# Patient Record
Sex: Female | Born: 1960
Health system: Southern US, Community
[De-identification: ages and names within clinical notes are randomized; demographics above are authoritative.]

## PROBLEM LIST (undated history)

## (undated) DIAGNOSIS — I10 Essential (primary) hypertension: Secondary | ICD-10-CM

## (undated) DIAGNOSIS — R7303 Prediabetes: Secondary | ICD-10-CM

## (undated) DIAGNOSIS — K5792 Diverticulitis of intestine, part unspecified, without perforation or abscess without bleeding: Secondary | ICD-10-CM

## (undated) DIAGNOSIS — E559 Vitamin D deficiency, unspecified: Secondary | ICD-10-CM

## (undated) DIAGNOSIS — F329 Major depressive disorder, single episode, unspecified: Secondary | ICD-10-CM

## (undated) DIAGNOSIS — Z87898 Personal history of other specified conditions: Secondary | ICD-10-CM

## (undated) DIAGNOSIS — T7840XA Allergy, unspecified, initial encounter: Secondary | ICD-10-CM

## (undated) DIAGNOSIS — R0600 Dyspnea, unspecified: Secondary | ICD-10-CM

## (undated) DIAGNOSIS — H40059 Ocular hypertension, unspecified eye: Secondary | ICD-10-CM

## (undated) DIAGNOSIS — R011 Cardiac murmur, unspecified: Secondary | ICD-10-CM

## (undated) HISTORY — PX: OTHER SURGICAL HISTORY: SHX169

## (undated) HISTORY — DX: Essential (primary) hypertension: I10

## (undated) HISTORY — DX: Allergy, unspecified, initial encounter: T78.40XA

## (undated) HISTORY — PX: COLONOSCOPY: SHX174

---

## 2005-06-07 ENCOUNTER — Ambulatory Visit: Payer: Self-pay | Admitting: Obstetrics and Gynecology

## 2006-06-08 ENCOUNTER — Ambulatory Visit: Payer: Self-pay | Admitting: Obstetrics and Gynecology

## 2007-07-11 ENCOUNTER — Ambulatory Visit: Payer: Self-pay | Admitting: Obstetrics and Gynecology

## 2008-09-03 ENCOUNTER — Ambulatory Visit: Payer: Self-pay | Admitting: Obstetrics and Gynecology

## 2009-06-24 ENCOUNTER — Emergency Department: Payer: Self-pay | Admitting: Emergency Medicine

## 2009-07-09 ENCOUNTER — Other Ambulatory Visit: Payer: Self-pay | Admitting: Family

## 2009-07-28 ENCOUNTER — Ambulatory Visit: Payer: Self-pay | Admitting: Gastroenterology

## 2009-09-10 ENCOUNTER — Ambulatory Visit: Payer: Self-pay | Admitting: Family Medicine

## 2010-03-09 ENCOUNTER — Encounter: Payer: Self-pay | Admitting: Family Medicine

## 2010-03-14 ENCOUNTER — Encounter: Payer: Self-pay | Admitting: Family Medicine

## 2010-04-14 ENCOUNTER — Encounter: Payer: Self-pay | Admitting: Family Medicine

## 2010-11-03 ENCOUNTER — Ambulatory Visit: Payer: Self-pay | Admitting: Family Medicine

## 2011-09-15 ENCOUNTER — Ambulatory Visit: Payer: Self-pay | Admitting: Family Medicine

## 2012-01-26 ENCOUNTER — Ambulatory Visit: Payer: Self-pay | Admitting: Family Medicine

## 2012-11-30 ENCOUNTER — Other Ambulatory Visit: Payer: Self-pay | Admitting: Family Medicine

## 2012-11-30 LAB — CBC WITH DIFFERENTIAL/PLATELET
Basophil #: 0.1 10*3/uL (ref 0.0–0.1)
Basophil %: 0.9 %
Eosinophil #: 0.1 10*3/uL (ref 0.0–0.7)
Eosinophil %: 0.7 %
HGB: 11.9 g/dL — ABNORMAL LOW (ref 12.0–16.0)
MCHC: 32.9 g/dL (ref 32.0–36.0)
MCV: 89 fL (ref 80–100)
Monocyte #: 0.5 x10 3/mm (ref 0.2–0.9)
Neutrophil #: 6.2 10*3/uL (ref 1.4–6.5)
Neutrophil %: 69.3 %
Platelet: 324 10*3/uL (ref 150–440)
WBC: 8.9 10*3/uL (ref 3.6–11.0)

## 2012-11-30 LAB — LIPID PANEL
Cholesterol: 175 mg/dL (ref 0–200)
HDL Cholesterol: 74 mg/dL — ABNORMAL HIGH (ref 40–60)
Ldl Cholesterol, Calc: 87 mg/dL (ref 0–100)
Triglycerides: 72 mg/dL (ref 0–200)

## 2012-11-30 LAB — HEMOGLOBIN A1C: Hemoglobin A1C: 5.3 % (ref 4.2–6.3)

## 2012-11-30 LAB — COMPREHENSIVE METABOLIC PANEL
Alkaline Phosphatase: 122 U/L (ref 50–136)
Co2: 25 mmol/L (ref 21–32)
Creatinine: 0.76 mg/dL (ref 0.60–1.30)
EGFR (Non-African Amer.): >60
Glucose: 68 mg/dL (ref 65–99)
SGOT(AST): 19 U/L (ref 15–37)

## 2012-12-03 ENCOUNTER — Other Ambulatory Visit: Payer: Self-pay | Admitting: Family Medicine

## 2012-12-03 LAB — FERRITIN: Ferritin (ARMC): 96 ng/mL (ref 8–388)

## 2013-03-05 ENCOUNTER — Ambulatory Visit: Payer: Self-pay | Admitting: Family Medicine

## 2013-03-06 ENCOUNTER — Ambulatory Visit: Payer: Self-pay | Admitting: Family Medicine

## 2014-03-28 ENCOUNTER — Other Ambulatory Visit: Payer: Self-pay | Admitting: Family Medicine

## 2014-03-28 LAB — FOLATE: FOLIC ACID: 23.8 ng/mL (ref 3.1–100.0)

## 2014-07-11 ENCOUNTER — Other Ambulatory Visit: Payer: Self-pay | Admitting: Family Medicine

## 2014-07-11 LAB — LIPID PANEL
CHOLESTEROL: 171 mg/dL (ref 0–200)
HDL Cholesterol: 80 mg/dL — ABNORMAL HIGH (ref 40–60)
Ldl Cholesterol, Calc: 82 mg/dL (ref 0–100)
Triglycerides: 47 mg/dL (ref 0–200)
VLDL Cholesterol, Calc: 9 mg/dL (ref 5–40)

## 2014-07-15 ENCOUNTER — Ambulatory Visit: Payer: Self-pay | Admitting: Family Medicine

## 2016-02-10 ENCOUNTER — Encounter: Payer: Self-pay | Admitting: Family Medicine

## 2016-02-10 ENCOUNTER — Ambulatory Visit (INDEPENDENT_AMBULATORY_CARE_PROVIDER_SITE_OTHER): Payer: BLUE CROSS/BLUE SHIELD | Admitting: Family Medicine

## 2016-02-10 VITALS — BP 118/70 | HR 96 | Temp 98.2°F | Resp 16 | Ht 62.0 in | Wt 180.5 lb

## 2016-02-10 DIAGNOSIS — R7303 Prediabetes: Secondary | ICD-10-CM | POA: Diagnosis not present

## 2016-02-10 DIAGNOSIS — Z0000 Encounter for general adult medical examination without abnormal findings: Secondary | ICD-10-CM | POA: Diagnosis not present

## 2016-02-10 DIAGNOSIS — F320 Major depressive disorder, single episode, mild: Secondary | ICD-10-CM

## 2016-02-10 DIAGNOSIS — H40059 Ocular hypertension, unspecified eye: Secondary | ICD-10-CM | POA: Insufficient documentation

## 2016-02-10 DIAGNOSIS — Z01419 Encounter for gynecological examination (general) (routine) without abnormal findings: Secondary | ICD-10-CM

## 2016-02-10 DIAGNOSIS — Z13220 Encounter for screening for lipoid disorders: Secondary | ICD-10-CM

## 2016-02-10 DIAGNOSIS — F329 Major depressive disorder, single episode, unspecified: Secondary | ICD-10-CM

## 2016-02-10 DIAGNOSIS — E669 Obesity, unspecified: Secondary | ICD-10-CM

## 2016-02-10 DIAGNOSIS — E559 Vitamin D deficiency, unspecified: Secondary | ICD-10-CM

## 2016-02-10 DIAGNOSIS — I10 Essential (primary) hypertension: Secondary | ICD-10-CM | POA: Insufficient documentation

## 2016-02-10 DIAGNOSIS — Z1239 Encounter for other screening for malignant neoplasm of breast: Secondary | ICD-10-CM

## 2016-02-10 DIAGNOSIS — Z114 Encounter for screening for human immunodeficiency virus [HIV]: Secondary | ICD-10-CM | POA: Diagnosis not present

## 2016-02-10 DIAGNOSIS — H40053 Ocular hypertension, bilateral: Secondary | ICD-10-CM | POA: Diagnosis not present

## 2016-02-10 DIAGNOSIS — F32A Depression, unspecified: Secondary | ICD-10-CM

## 2016-02-10 DIAGNOSIS — Z124 Encounter for screening for malignant neoplasm of cervix: Secondary | ICD-10-CM

## 2016-02-10 DIAGNOSIS — J069 Acute upper respiratory infection, unspecified: Secondary | ICD-10-CM

## 2016-02-10 DIAGNOSIS — Z87898 Personal history of other specified conditions: Secondary | ICD-10-CM | POA: Insufficient documentation

## 2016-02-10 DIAGNOSIS — R011 Cardiac murmur, unspecified: Secondary | ICD-10-CM | POA: Insufficient documentation

## 2016-02-10 HISTORY — DX: Ocular hypertension, unspecified eye: H40.059

## 2016-02-10 NOTE — Progress Notes (Signed)
Name: Margaret Kramer   MRN: [id number]    DOB: 25-Sep-1961   Date:02/10/2016       Progress Note  Subjective  Chief Complaint  Chief Complaint  Patient presents with  . Annual Exam  . URI    patient has been taking care of her sick dad and got sick. she has been coughing since Thursday. productive. congested. patient has taken otc Thereaflu, Sudafed & Tylenol.  Marland Kitchen GI Problem    patient had a gi bug a few weeks ago due to vomiting and diarrhea.  . Orders    mammogram    HPI  Well Woman : she has not been sexually in over 4 years, no vaginal discharge, no post-menopausal bleeding or bladder problems  URI: she states over the past week she has developed nasal congestion, rhinorrhea, post-nasal drainage and cough. Symptoms are gradually improving. She has been afebrile. She has been taking otc medications  Prediabetes: she has gained weight since last visit, she denies polyphagia, polydipsia or polyuria.  Obesity: she has gained weight, she has been walking daily, and over the past month she has been trying to eat more fruit and vegetables.    Patient Active Problem List   Diagnosis Date Noted  . Cardiac murmur 02/10/2016  . History of syncope 02/10/2016  . Prediabetes 02/10/2016  . Increased pressure in the eye 02/10/2016  . Vitamin D deficiency 02/10/2016  . Obesity (BMI 30.0-34.9) 02/10/2016    Past Surgical History  Procedure Laterality Date  . Ganglion cyst removal Right     No family history on file.  Social History   Social History  . Marital Status: Single    Spouse Name: N/A  . Number of Children: N/A  . Years of Education: N/A   Occupational History  . Not on file.   Social History Main Topics  . Smoking status: Never Smoker   . Smokeless tobacco: Not on file  . Alcohol Use: 0.0 oz/week    0 Standard drinks or equivalent per week     Comment: occasional  . Drug Use: No  . Sexual Activity: No   Other Topics Concern  . Not on file   Social  History Narrative  . No narrative on file    No current outpatient prescriptions on file.  No Known Allergies   ROS  Constitutional: Negative for fever, positive for weight change.  Respiratory: Positive for cough but no shortness of breath.   Cardiovascular: Negative for chest pain or palpitations.  Gastrointestinal: Negative for abdominal pain, no bowel changes.  Musculoskeletal: Negative for gait problem or joint swelling.  Skin: Negative for rash.  Neurological: Negative for dizziness or headache.  No other specific complaints in a complete review of systems (except as listed in HPI above).  Objective  Filed Vitals:   02/10/16 1056  BP: 118/70  Pulse: 96  Temp: 98.2 F (36.8 C)  TempSrc: Oral  Resp: 16  Height: 5\' 2"  (1.575 m)  Weight: 180 lb 8 oz (81.874 kg)  SpO2: 99%    Body mass index is 33.01 kg/(m^2).  Physical Exam  Constitutional: Patient appears well-developed and well-nourished. No distress.  HENT: Head: Normocephalic and atraumatic. Ears: B TMs ok, no erythema or effusion; Nose: Nose normal. Mouth/Throat: Oropharynx is clear and moist. No oropharyngeal exudate.  Eyes: Conjunctivae and EOM are normal. Pupils are equal, round, and reactive to light. No scleral icterus.  Neck: Normal range of motion. Neck supple. No JVD present. No thyromegaly present.  Cardiovascular: Normal rate, regular rhythm and normal heart sounds.  No murmur heard. No BLE edema. Pulmonary/Chest: Effort normal and breath sounds normal. No respiratory distress. Abdominal: Soft. Bowel sounds are normal, no distension. There is no tenderness. no masses Breast: no lumps or masses, no nipple discharge or rashes FEMALE GENITALIA:  External genitalia normal External urethra normal Vaginal vault normal without discharge or lesions Cervix normal without discharge or lesions Bimanual exam normal without masses RECTAL: not done Musculoskeletal: Normal range of motion, no joint effusions.  No gross deformities Neurological: he is alert and oriented to person, place, and time. No cranial nerve deficit. Coordination, balance, strength, speech and gait are normal.  Skin: Skin is warm and dry. No rash noted. No erythema.  Psychiatric: Patient has a normal mood and affect. behavior is normal. Judgment and thought content normal.  PHQ2/9: Depression screen PHQ 2/9 02/10/2016  Decreased Interest 0  Down, Depressed, Hopeless 2  PHQ - 2 Score 2  Altered sleeping 2  Tired, decreased energy 2  Change in appetite 0  Feeling bad or failure about yourself  0  Trouble concentrating 0  Moving slowly or fidgety/restless 0  Suicidal thoughts 0  PHQ-9 Score 6     Fall Risk: Fall Risk  02/10/2016  Falls in the past year? No     Functional Status Survey: Is the patient deaf or have difficulty hearing?: No Does the patient have difficulty seeing, even when wearing glasses/contacts?: Yes (patient was seeing Dr. Gloriann Loan since 2014, was told that the pressure in her eyes were too high) Does the patient have difficulty concentrating, remembering, or making decisions?: No Does the patient have difficulty walking or climbing stairs?: No Does the patient have difficulty dressing or bathing?: No Does the patient have difficulty doing errands alone such as visiting a doctor's office or shopping?: No    Assessment & Plan  1. Well woman exam  Discussed importance of 150 minutes of physical activity weekly, eat two servings of fish weekly, eat one serving of tree nuts ( cashews, pistachios, pecans, almonds.Marland Kitchen) every other day, eat 6 servings of fruit/vegetables daily and drink plenty of water and avoid sweet beverages.   2. Prediabetes  - Hemoglobin A1c  3. Vitamin D deficiency  - VITAMIN D 25 Hydroxy (Vit-D Deficiency, Fractures)  4. Obesity (BMI 30.0-34.9)  Discussed with the patient the risk posed by an increased BMI. Discussed importance of portion control, calorie counting and at  least 150 minutes of physical activity weekly. Avoid sweet beverages and drink more water. Eat at least 6 servings of fruit and vegetables daily   5. Increased pressure in the eye, bilateral  -referral ophthalmologist   6. Breast cancer screening  - MM Digital Screening; Future  7. Cervical cancer screening  - PapLb, HPV, rfx16/18  8. Screening for HIV (human immunodeficiency virus)  - HIV antibody  9. Lipid screening  - Lipid panel  11. Mild depression  She does not want medications at this time, she states carrying for her father if taking a toll on her, also feeling like she is aging too fast. Discussed therapy, and she will contact [facility]

## 2016-02-11 ENCOUNTER — Other Ambulatory Visit: Payer: Self-pay | Admitting: Family Medicine

## 2016-02-11 LAB — LIPID PANEL
CHOLESTEROL TOTAL: 192 mg/dL (ref 100–199)
Chol/HDL Ratio: 3.2 ratio units (ref 0.0–4.4)
HDL: 60 mg/dL (ref >39)
LDL Calculated: 114 mg/dL — ABNORMAL HIGH (ref 0–99)
TRIGLYCERIDES: 89 mg/dL (ref 0–149)
VLDL CHOLESTEROL CAL: 18 mg/dL (ref 5–40)

## 2016-02-11 LAB — HEMOGLOBIN A1C
ESTIMATED AVERAGE GLUCOSE: 117 mg/dL
Hgb A1c MFr Bld: 5.7 % — ABNORMAL HIGH (ref 4.8–5.6)

## 2016-02-11 LAB — HIV ANTIBODY (ROUTINE TESTING W REFLEX): HIV SCREEN 4TH GENERATION: NONREACTIVE

## 2016-02-11 LAB — VITAMIN D 25 HYDROXY (VIT D DEFICIENCY, FRACTURES): VIT D 25 HYDROXY: 24.9 ng/mL — AB (ref 30.0–100.0)

## 2016-02-11 MED ORDER — VITAMIN D (ERGOCALCIFEROL) 1.25 MG (50000 UNIT) PO CAPS: 50000.0000 [IU] | ORAL_CAPSULE | ORAL | Status: DC

## 2016-02-13 LAB — PAPLB, HPV, RFX16/18
HPV, high-risk: NEGATIVE
PAP Smear Comment: 0

## 2016-02-15 ENCOUNTER — Telehealth: Payer: Self-pay

## 2016-02-15 NOTE — Telephone Encounter (Signed)
Called patient to inform her of her results, but then she told me that her father passed this morning. Her mother is a patient of Dr. Ancil Boozer.

## 2016-03-18 DIAGNOSIS — H40003 Preglaucoma, unspecified, bilateral: Secondary | ICD-10-CM | POA: Diagnosis not present

## 2016-06-15 ENCOUNTER — Ambulatory Visit
Admission: EM | Admit: 2016-06-15 | Discharge: 2016-06-15 | Disposition: A | Discharge status: Home / Self Care | Payer: BLUE CROSS/BLUE SHIELD | Attending: Emergency Medicine | Admitting: Emergency Medicine

## 2016-06-15 ENCOUNTER — Encounter: Payer: Self-pay | Admitting: *Deleted

## 2016-06-15 DIAGNOSIS — R1030 Lower abdominal pain, unspecified: Secondary | ICD-10-CM | POA: Diagnosis not present

## 2016-06-15 LAB — URINALYSIS COMPLETE WITH MICROSCOPIC (ARMC ONLY)
Bilirubin Urine: NEGATIVE
GLUCOSE, UA: NEGATIVE mg/dL
HGB URINE DIPSTICK: NEGATIVE
Ketones, ur: NEGATIVE mg/dL
LEUKOCYTES UA: NEGATIVE
Nitrite: NEGATIVE
PH: 7 (ref 5.0–8.0)
SQUAMOUS EPITHELIAL / LPF: NONE SEEN
Specific Gravity, Urine: 1.02 (ref 1.005–1.030)

## 2016-06-15 LAB — WET PREP, GENITAL
CLUE CELLS WET PREP: NONE SEEN
SPERM: NONE SEEN
TRICH WET PREP: NONE SEEN
YEAST WET PREP: NONE SEEN

## 2016-06-15 MED ORDER — NITROFURANTOIN MONOHYD MACRO 100 MG PO CAPS: 100.0000 mg | ORAL_CAPSULE | Freq: Two times a day (BID) | ORAL | 0 refills | Status: DC

## 2016-06-15 MED ORDER — PHENAZOPYRIDINE HCL 200 MG PO TABS: 200.0000 mg | ORAL_TABLET | Freq: Three times a day (TID) | ORAL | 0 refills | Status: DC | PRN

## 2016-06-15 MED ORDER — IBUPROFEN 800 MG PO TABS: 800.0000 mg | ORAL_TABLET | Freq: Three times a day (TID) | ORAL | 0 refills | Status: DC

## 2016-06-15 NOTE — ED Provider Notes (Signed)
HPI  SUBJECTIVE:  Margaret Kramer is a 55 y.o. female who presents with low midline abdominal/pelvic pain for the past day. She describes as constant and like pressure. She reports nausea, chills, cloudy urine. States that she had a loose, watery, nonbloody mucoid bowel movement this morning. Denies melena or hematochezia. She took an Alka-Seltzer pain with Tylenol at 0400 this morning with improvement. There are no aggravating factors. She has not tried anything else. She reports having an appetite, but is afraid to eat. She is tolerating by mouth. Pain is not associated with urination, defecation, movement or eating.  No sick contacts, change in diet. No N/V, fevers, CP, SOB, cough, anorexia, constipation. No abd distension, dysuria, urinary urgency, frequency, hematuria. No back pain. No syncope.  No vaginal bleeding, abnormal d/c, vaginal odor. Not been sexually active in 4 years. Is postmenopausal.. No ETOH abuse, pt is not a smoker. No excess NSAID use. No recent trauma to the abdomen. She has past medical history of hypertension and diverticulitis, remote history of UTIs. Also history of Trichomonas, yeast infections. No history of BV, gonorrhea, chlamydia, syphilis, HSV. No h/o MI, HTN, DM, CAD, afib, CHF. No h/o abdominal surgeries, PUD, biliary disease, cirrhosis, pancreatitis, diverticulosis, obstruction, GIB. No h/o obstructing nephrolithiasis, AAA. No h/o PID. No h/o CA, DVT/PE, hypercoagulability. No h/o UC, IBS, HIV, recent steroid use. last colonoscopy 8 years ago, had diveriticulae. PMD Dr. Ancil Boozer  Past Medical History:  Diagnosis Date  . Allergy   . Hypertension     Past Surgical History:  Procedure Laterality Date  . ganglion cyst removal Right     History reviewed. No pertinent family history.  Social History  Substance Use Topics  . Smoking status: Never Smoker  . Smokeless tobacco: Never Used  . Alcohol use 0.0 oz/week     Comment: occasional    No current  facility-administered medications for this encounter.   Current Outpatient Prescriptions:  Marland Kitchen  Vitamin D, Ergocalciferol, (DRISDOL) 50000 units CAPS capsule, Take 1 capsule (50,000 Units total) by mouth every 7 (seven) days., Disp: 12 capsule, Rfl: 0 .  ibuprofen (ADVIL,MOTRIN) 800 MG tablet, Take 1 tablet (800 mg total) by mouth 3 (three) times daily., Disp: 30 tablet, Rfl: 0 .  nitrofurantoin, macrocrystal-monohydrate, (MACROBID) 100 MG capsule, Take 1 capsule (100 mg total) by mouth 2 (two) times daily. X 5 days, Disp: 10 capsule, Rfl: 0 .  phenazopyridine (PYRIDIUM) 200 MG tablet, Take 1 tablet (200 mg total) by mouth 3 (three) times daily as needed for pain., Disp: 6 tablet, Rfl: 0  No Known Allergies   ROS  As noted in HPI.   Physical Exam  BP (!) 159/92 (BP Location: Left Arm)   Pulse 100   Temp 99.4 F (37.4 C) (Oral)   Resp 18   Ht 5\' 2"  (1.575 m)   Wt 180 lb (81.6 kg)   SpO2 100%   BMI 32.92 kg/m   Constitutional: Well developed, well nourished, no acute distress Eyes: PERRL, EOMI, conjunctiva normal bilaterally HENT: Normocephalic, atraumatic,mucus membranes moist Respiratory: Clear to auscultation bilaterally, no rales, no wheezing, no rhonchi Cardiovascular: Normal rate and rhythm, no murmurs, no gallops, no rubs GI:  normal appearance. Soft, nondistended, normal bowel sounds. positive suprapubic tenderness. no rebound, no guarding . Negative Murphy, negative McBurney. No right lower quadrant or left lower quadrant tenderness. No flank tenderness.  Back: no CVAT GU:  Normal external genitalia, no discharge. Unable to completely visualize os due to patient discomfort. Uterus smooth,  nontender. No CMT. No adnexal tenderness.  skin: No rash, skin intact Musculoskeletal: No edema, no tenderness, no deformities Neurologic: Alert & oriented x 3, CN II-XII grossly intact, no motor deficits, sensation grossly intact Psychiatric: Speech and behavior appropriate   ED  Course   Medications - No data to display  Orders Placed This Encounter  Procedures  . Pelvic exam    Standing Status:   Standing    Number of Occurrences:   1  . Wet prep, genital    Standing Status:   Standing    Number of Occurrences:   1  . Urine culture    Standing Status:   Standing    Number of Occurrences:   1    Order Specific Question:   List patient's active antibiotics    Answer:   macrobid  . Urinalysis complete, with microscopic    Standing Status:   Standing    Number of Occurrences:   1   Results for orders placed or performed during the hospital encounter of 06/15/16 (from the past 24 hour(s))  Urinalysis complete, with microscopic     Status: Abnormal   Collection Time: 06/15/16 12:06 PM  Result Value Ref Range   Color, Urine YELLOW YELLOW   APPearance CLEAR CLEAR   Glucose, UA NEGATIVE NEGATIVE mg/dL   Bilirubin Urine NEGATIVE NEGATIVE   Ketones, ur NEGATIVE NEGATIVE mg/dL   Specific Gravity, Urine 1.020 1.005 - 1.030   Hgb urine dipstick NEGATIVE NEGATIVE   pH 7.0 5.0 - 8.0   Protein, ur TRACE (A) NEGATIVE mg/dL   Nitrite NEGATIVE NEGATIVE   Leukocytes, UA NEGATIVE NEGATIVE   RBC / HPF 0-5 0 - 5 RBC/hpf   WBC, UA 0-5 0 - 5 WBC/hpf   Bacteria, UA RARE (A) NONE SEEN   Squamous Epithelial / LPF NONE SEEN NONE SEEN   Mucous PRESENT   Wet prep, genital     Status: Abnormal   Collection Time: 06/15/16 12:32 PM  Result Value Ref Range   Yeast Wet Prep HPF POC NONE SEEN NONE SEEN   Trich, Wet Prep NONE SEEN NONE SEEN   Clue Cells Wet Prep HPF POC NONE SEEN NONE SEEN   WBC, Wet Prep HPF POC FEW (A) NONE SEEN   Sperm NONE SEEN    No results found.  ED Clinical Impression  Lower abdominal pain   ED Assessment/Plan  [facility] narcotic database reviewed. No opiate Prescriptions in the past 6 months.  Patient with trace protein, no blood. No leukocytes, nitrite. Rare bacteria. Urine otherwise negative.  We'll check pelvic with wet prep only.  Given the patient has not had any intercourse in 4 years, not checking gonorrhea or Chlamydia. Wet prep negative for yeast, Trichomonas, BV.   Pt abd exam is benign, no peritoneal signs. No evidence of surgical abd. Doubt SBO, mesenteric ischemia, appendicitis, hepatitis, cholecystitis, pancreatitis, or perforated viscus. No evidence to support or suggest GYN pathology such as ovarian torsion or infection.   sending  urine off for culture to confirm absence of UTI. Patient may have an interstitial cystitis. In the meantime, home with Pyridium, 5 days of Macrobid, ibuprofen. Follow-up with PMD as needed. To the ER if gets worse or for any concerns. Discussed labs,  MDM, plan and followup with patient . Discussed sn/sx that should prompt return to the ED. Patient agrees with plan.   *This clinic note was created using Dragon dictation software. Therefore, there may be occasional mistakes despite careful proofreading.  ?  Melynda Ripple, MD 06/15/16 1251

## 2016-06-15 NOTE — ED Triage Notes (Signed)
Patient started having supra pubic abdominal pain yesterday with some early diarrhea. Patient does have a history of diverticulitis.

## 2016-06-16 LAB — URINE CULTURE

## 2016-09-15 DIAGNOSIS — H40003 Preglaucoma, unspecified, bilateral: Secondary | ICD-10-CM | POA: Diagnosis not present

## 2016-12-29 ENCOUNTER — Ambulatory Visit (INDEPENDENT_AMBULATORY_CARE_PROVIDER_SITE_OTHER): Payer: BLUE CROSS/BLUE SHIELD | Admitting: Family Medicine

## 2016-12-29 ENCOUNTER — Encounter: Payer: Self-pay | Admitting: Family Medicine

## 2016-12-29 VITALS — BP 136/92 | HR 94 | Temp 98.7°F | Resp 16 | Wt 172.6 lb

## 2016-12-29 DIAGNOSIS — I10 Essential (primary) hypertension: Secondary | ICD-10-CM

## 2016-12-29 DIAGNOSIS — F32A Depression, unspecified: Secondary | ICD-10-CM | POA: Insufficient documentation

## 2016-12-29 DIAGNOSIS — E559 Vitamin D deficiency, unspecified: Secondary | ICD-10-CM | POA: Diagnosis not present

## 2016-12-29 DIAGNOSIS — G4709 Other insomnia: Secondary | ICD-10-CM | POA: Diagnosis not present

## 2016-12-29 DIAGNOSIS — R7303 Prediabetes: Secondary | ICD-10-CM

## 2016-12-29 DIAGNOSIS — R1030 Lower abdominal pain, unspecified: Secondary | ICD-10-CM | POA: Diagnosis not present

## 2016-12-29 DIAGNOSIS — F320 Major depressive disorder, single episode, mild: Secondary | ICD-10-CM

## 2016-12-29 DIAGNOSIS — E669 Obesity, unspecified: Secondary | ICD-10-CM | POA: Diagnosis not present

## 2016-12-29 DIAGNOSIS — Z13220 Encounter for screening for lipoid disorders: Secondary | ICD-10-CM | POA: Diagnosis not present

## 2016-12-29 DIAGNOSIS — E66811 Obesity, class 1: Secondary | ICD-10-CM

## 2016-12-29 HISTORY — DX: Depression, unspecified: F32.A

## 2016-12-29 LAB — POCT URINALYSIS DIPSTICK
BILIRUBIN UA: NEGATIVE
Blood, UA: NEGATIVE
Glucose, UA: NEGATIVE
KETONES UA: NEGATIVE
Nitrite, UA: NEGATIVE
PH UA: 6
PROTEIN UA: NEGATIVE
SPEC GRAV UA: 1.02
Urobilinogen, UA: NEGATIVE

## 2016-12-29 MED ORDER — NITROFURANTOIN MONOHYD MACRO 100 MG PO CAPS: 100.0000 mg | ORAL_CAPSULE | Freq: Two times a day (BID) | ORAL | 0 refills | Status: DC

## 2016-12-29 NOTE — Progress Notes (Signed)
Name: Margaret Kramer   MRN: [id number]    DOB: 1961-08-10   Date:12/29/2016       Progress Note  Subjective  Chief Complaint  Chief Complaint  Patient presents with  . Urinary Tract Infection    frequency and painful urination for a couple of weeks and fullness     HPI  Supra pubic pain: she had similar symptoms last August and treated with Macrobid, urine showed contamination but she responded to therapy. She states this episode started a couple of weeks ago, she states that pain on supra pubic area after she voids and when she holds too long before she voids, no hematuria, fever, nausea or vomiting or back pain.   Major Depression: still missed her father that died about one year ago. She states she still has crying spells from missing him, even though he was 57 yo and had dementia. Discussed contacting hospice for free counseling . Brother diagnosed with lung cancer Fall 2017 and she is also worried about him.   Pre-diabetes: lost 8 lbs since last visit, but eating healthier, she denies polyphagia, polydipsia or polyuria.   Insomnia: she has been waking multiple times during the night over the past few months, she keeps the TV on all night. She worries about living by herself.   HTN: never on medication, her bp this week has been elevated in the 130's/90's, she wants to hold off on medication, usually bp well controlled, we will get labs and return for follow up in about one month  Patient Active Problem List   Diagnosis Date Noted  . Mild depression ([place]) 12/29/2016  . Cardiac murmur 02/10/2016  . History of syncope 02/10/2016  . Prediabetes 02/10/2016  . Increased pressure in the eye 02/10/2016  . Vitamin D deficiency 02/10/2016  . Obesity (BMI 30.0-34.9) 02/10/2016    Past Surgical History:  Procedure Laterality Date  . ganglion cyst removal Right     Family History  Problem Relation Age of Onset  . Diabetes Mother   . Hypothyroidism Mother   . Alcoholism Father    . Dementia Father   . Cancer Brother 48    lung cancer    Social History   Social History  . Marital status: Single    Spouse name: N/A  . Number of children: N/A  . Years of education: N/A   Occupational History  . Not on file.   Social History Main Topics  . Smoking status: Never Smoker  . Smokeless tobacco: Never Used  . Alcohol use 0.0 oz/week     Comment: occasional  . Drug use: No  . Sexual activity: No   Other Topics Concern  . Not on file   Social History Narrative  . No narrative on file     Current Outpatient Prescriptions:  .  Cholecalciferol (VITAMIN D) 2000 units CAPS, Take 1 capsule by mouth daily., Disp: , Rfl:  .  ibuprofen (ADVIL,MOTRIN) 800 MG tablet, Take 1 tablet (800 mg total) by mouth 3 (three) times daily., Disp: 30 tablet, Rfl: 0 .  nitrofurantoin, macrocrystal-monohydrate, (MACROBID) 100 MG capsule, Take 1 capsule (100 mg total) by mouth 2 (two) times daily. X 5 days, Disp: 10 capsule, Rfl: 0  No Known Allergies   ROS  Constitutional: Negative for fever, positive for  weight change.  Respiratory: Negative for cough and shortness of breath.   Cardiovascular: Negative for chest pain or palpitations.  Gastrointestinal: Negative for abdominal pain, no bowel changes.  Musculoskeletal: Negative  for gait problem or joint swelling.  Skin: Negative for rash.  Neurological: Negative for dizziness or headache.  No other specific complaints in a complete review of systems (except as listed in HPI above).  Objective  Vitals:   12/29/16 1232  BP: (!) 136/92  Pulse: 94  Resp: 16  Temp: 98.7 F (37.1 C)  TempSrc: Oral  SpO2: 97%  Weight: 172 lb 9 oz (78.3 kg)    Body mass index is 31.56 kg/m.  Physical Exam  Constitutional: Patient appears well-developed and well-nourished. Obese  No distress.  HEENT: head atraumatic, normocephalic, pupils equal and reactive to light,  neck supple, throat within normal limits Cardiovascular: Normal  rate, regular rhythm and normal heart sounds.  No murmur heard. No BLE edema. Pulmonary/Chest: Effort normal and breath sounds normal. No respiratory distress. Abdominal: Soft.  There is no tenderness. Negative CVA tenderness Psychiatric: Patient has a normal mood and affect. behavior is normal. Judgment and thought content normal.  Recent Results (from the past 2160 hour(s))  POCT urinalysis dipstick     Status: Abnormal   Collection Time: 12/29/16 12:47 PM  Result Value Ref Range   Color, UA yellow    Clarity, UA clear    Glucose, UA neg    Bilirubin, UA neg    Ketones, UA neg    Spec Grav, UA 1.020    Blood, UA neg    pH, UA 6.0    Protein, UA neg    Urobilinogen, UA negative    Nitrite, UA neg    Leukocytes, UA small (1+) (A) Negative     PHQ2/9: Depression screen [facility] 2/9 12/29/2016 02/10/2016  Decreased Interest 0 0  Down, Depressed, Hopeless 0 2  PHQ - 2 Score 0 2  Altered sleeping 3 2  Tired, decreased energy 2 2  Change in appetite 0 0  Feeling bad or failure about yourself  0 0  Trouble concentrating 1 0  Moving slowly or fidgety/restless 0 0  Suicidal thoughts 0 0  PHQ-9 Score 6 6     Fall Risk: Fall Risk  02/10/2016  Falls in the past year? No     Assessment & Plan  1. Lower abdominal pain  - Urine culture - nitrofurantoin, macrocrystal-monohydrate, (MACROBID) 100 MG capsule; Take 1 capsule (100 mg total) by mouth 2 (two) times daily. X 5 days  Dispense: 10 capsule; Refill: 0  2. Vitamin D deficiency  - VITAMIN D 25 Hydroxy (Vit-D Deficiency, Fractures)  3. Prediabetes  Recheck labs  4. Obesity (BMI 30.0-34.9)  - Hemoglobin A1c - Insulin, fasting  5. Mild depression ([facility])  - COMPLETE METABOLIC PANEL WITH GFR - CBC with Differential/Platelet  6. Lipid screening  - Lipid panel  7. Hypertension, benign  We will recheck on her next visit, she has never been on medication and prefers not to start today  - COMPLETE METABOLIC PANEL WITH  GFR - CBC with Differential/Platelet  8. Other insomnia  She will turn off TV when she goes to bed, she does not want to start medication at this time

## 2016-12-31 LAB — URINE CULTURE: Organism ID, Bacteria: NO GROWTH

## 2017-02-23 ENCOUNTER — Other Ambulatory Visit: Payer: Self-pay | Admitting: Family Medicine

## 2017-02-23 DIAGNOSIS — E669 Obesity, unspecified: Secondary | ICD-10-CM | POA: Diagnosis not present

## 2017-02-23 DIAGNOSIS — F320 Major depressive disorder, single episode, mild: Secondary | ICD-10-CM | POA: Diagnosis not present

## 2017-02-23 DIAGNOSIS — E559 Vitamin D deficiency, unspecified: Secondary | ICD-10-CM | POA: Diagnosis not present

## 2017-02-23 DIAGNOSIS — I10 Essential (primary) hypertension: Secondary | ICD-10-CM | POA: Diagnosis not present

## 2017-02-23 LAB — COMPLETE METABOLIC PANEL WITH GFR
ALBUMIN: 4.2 g/dL (ref 3.6–5.1)
ALK PHOS: 101 U/L (ref 33–130)
ALT: 20 U/L (ref 6–29)
AST: 15 U/L (ref 10–35)
BUN: 19 mg/dL (ref 7–25)
CALCIUM: 9.5 mg/dL (ref 8.6–10.4)
CO2: 29 mmol/L (ref 20–31)
CREATININE: 0.75 mg/dL (ref 0.50–1.05)
Chloride: 103 mmol/L (ref 98–110)
GFR, Est African American: >89 mL/min (ref >=60)
GFR, Est Non African American: 89 mL/min (ref >=60)
Glucose, Bld: 93 mg/dL (ref 65–99)
Potassium: 4.8 mmol/L (ref 3.5–5.3)
Sodium: 138 mmol/L (ref 135–146)
TOTAL PROTEIN: 7.3 g/dL (ref 6.1–8.1)
Total Bilirubin: 0.5 mg/dL (ref 0.2–1.2)

## 2017-02-23 LAB — CBC WITH DIFFERENTIAL/PLATELET
Basophils Absolute: 0 cells/uL (ref 0–200)
Basophils Relative: 0 %
EOS PCT: 4 %
Eosinophils Absolute: 196 cells/uL (ref 15–500)
HEMATOCRIT: 38.5 % (ref 35.0–45.0)
Hemoglobin: 12.4 g/dL (ref 11.7–15.5)
LYMPHS PCT: 59 %
Lymphs Abs: 2891 cells/uL (ref 850–3900)
MCH: 28.7 pg (ref 27.0–33.0)
MCHC: 32.2 g/dL (ref 32.0–36.0)
MCV: 89.1 fL (ref 80.0–100.0)
MONO ABS: 245 {cells}/uL (ref 200–950)
MPV: 9.8 fL (ref 7.5–12.5)
Monocytes Relative: 5 %
NEUTROS PCT: 32 %
Neutro Abs: 1568 cells/uL (ref 1500–7800)
Platelets: 321 10*3/uL (ref 140–400)
RBC: 4.32 MIL/uL (ref 3.80–5.10)
RDW: 13.3 % (ref 11.0–15.0)
WBC: 4.9 10*3/uL (ref 3.8–10.8)

## 2017-02-23 LAB — LIPID PANEL
Cholesterol: 199 mg/dL (ref <200)
HDL: 86 mg/dL (ref >50)
LDL Cholesterol: 100 mg/dL — ABNORMAL HIGH (ref <100)
Total CHOL/HDL Ratio: 2.3 Ratio (ref <5.0)
Triglycerides: 63 mg/dL (ref <150)
VLDL: 13 mg/dL (ref <30)

## 2017-02-24 LAB — INSULIN, FASTING: Insulin fasting, serum: 7.8 u[IU]/mL (ref 2.0–19.6)

## 2017-02-24 LAB — HEMOGLOBIN A1C
HEMOGLOBIN A1C: 5.3 % (ref <5.7)
MEAN PLASMA GLUCOSE: 105 mg/dL

## 2017-02-24 LAB — VITAMIN D 25 HYDROXY (VIT D DEFICIENCY, FRACTURES): VIT D 25 HYDROXY: 29 ng/mL — AB (ref 30–100)

## 2017-02-28 ENCOUNTER — Encounter: Payer: Self-pay | Admitting: Family Medicine

## 2017-02-28 ENCOUNTER — Ambulatory Visit (INDEPENDENT_AMBULATORY_CARE_PROVIDER_SITE_OTHER): Payer: BLUE CROSS/BLUE SHIELD | Admitting: Family Medicine

## 2017-02-28 ENCOUNTER — Other Ambulatory Visit: Payer: Self-pay | Admitting: Family Medicine

## 2017-02-28 VITALS — BP 128/70 | HR 92 | Temp 98.3°F | Resp 16 | Ht 62.0 in | Wt 174.9 lb

## 2017-02-28 DIAGNOSIS — Z1231 Encounter for screening mammogram for malignant neoplasm of breast: Secondary | ICD-10-CM

## 2017-02-28 DIAGNOSIS — Z0001 Encounter for general adult medical examination with abnormal findings: Secondary | ICD-10-CM | POA: Diagnosis not present

## 2017-02-28 DIAGNOSIS — Z1239 Encounter for other screening for malignant neoplasm of breast: Secondary | ICD-10-CM

## 2017-02-28 DIAGNOSIS — R1030 Lower abdominal pain, unspecified: Secondary | ICD-10-CM | POA: Diagnosis not present

## 2017-02-28 DIAGNOSIS — N3946 Mixed incontinence: Secondary | ICD-10-CM

## 2017-02-28 DIAGNOSIS — Z01419 Encounter for gynecological examination (general) (routine) without abnormal findings: Secondary | ICD-10-CM

## 2017-02-28 NOTE — Progress Notes (Signed)
Name: Margaret Kramer   MRN: [id number]    DOB: 1960/12/09   Date:02/28/2017       Progress Note  Subjective  Chief Complaint  Chief Complaint  Patient presents with  . Annual Exam    HPI  Well woman: she is not sexually active, normal pap smear, negative HPV back in 2017, no vaginal discharge, she has some urinary stress incontinence, also has some urgency ( chronic ) - she does not want to see PT or start medication at this time. Due for mammogram ( no breast lumps), colonoscopy is up to date.   Left lower quadrant pain: symptoms going on for about 9 months, intermittent, initially lower abdomen, but after that is left lower quadrant, cramping like, it happens in spells for about 3 days but is not constant during episodes. Denies change in bowel movements -has bristol scale 3-4 - and two to three times daily, no blood or mucus in stools, no fever or change in appetite.    Patient Active Problem List   Diagnosis Date Noted  . Mild depression ([place]) 12/29/2016  . Other insomnia 12/29/2016  . Cardiac murmur 02/10/2016  . Hypertension, benign 02/10/2016  . History of syncope 02/10/2016  . Prediabetes 02/10/2016  . Increased pressure in the eye 02/10/2016  . Vitamin D deficiency 02/10/2016  . Obesity (BMI 30.0-34.9) 02/10/2016    Past Surgical History:  Procedure Laterality Date  . ganglion cyst removal Right     Family History  Problem Relation Age of Onset  . Diabetes Mother   . Hypothyroidism Mother   . Alcoholism Father   . Dementia Father   . Cancer Brother 28    lung cancer    Social History   Social History  . Marital status: Single    Spouse name: N/A  . Number of children: N/A  . Years of education: N/A   Occupational History  . Not on file.   Social History Main Topics  . Smoking status: Never Smoker  . Smokeless tobacco: Never Used  . Alcohol use 0.0 oz/week     Comment: occasionally  . Drug use: No  . Sexual activity: No   Other Topics  Concern  . Not on file   Social History Narrative  . No narrative on file     Current Outpatient Prescriptions:  .  Cholecalciferol (VITAMIN D) 2000 units CAPS, Take 1 capsule by mouth daily., Disp: , Rfl:  .  ibuprofen (ADVIL,MOTRIN) 800 MG tablet, Take 1 tablet (800 mg total) by mouth 3 (three) times daily., Disp: 30 tablet, Rfl: 0  No Known Allergies   ROS  Constitutional: Negative for fever or weight change.  Respiratory: Negative for cough and shortness of breath.   Cardiovascular: Negative for chest pain or palpitations.  Gastrointestinal: Positive  For intermittent  abdominal pain, no bowel changes.  Musculoskeletal: Negative for gait problem or joint swelling.  Skin: Negative for rash.  Neurological: Negative for dizziness or headache.  No other specific complaints in a complete review of systems (except as listed in HPI above).  Objective  Vitals:   02/28/17 1412  BP: 128/70  Pulse: 92  Resp: 16  Temp: 98.3 F (36.8 C)  TempSrc: Oral  SpO2: 98%  Weight: 174 lb 14.4 oz (79.3 kg)  Height: _0  (1.575 m)    Body mass index is 31.99 kg/m.  Physical Exam  Constitutional: Patient appears well-developed and obesity. No distress.  HENT: Head: Normocephalic and atraumatic. Ears: B TMs  ok, no erythema or effusion; Nose: Nose normal. Mouth/Throat: Oropharynx is clear and moist. No oropharyngeal exudate.  Eyes: Conjunctivae and EOM are normal. Pupils are equal, round, and reactive to light. No scleral icterus.  Neck: Normal range of motion. Neck supple. No JVD present. No thyromegaly present.  Cardiovascular: Normal rate, regular rhythm and normal heart sounds.  No murmur heard. No BLE edema. Pulmonary/Chest: Effort normal and breath sounds normal. No respiratory distress. Abdominal: Soft. Bowel sounds are normal, no distension. There is no tenderness. no masses Breast: lumpy breast, no nipple discharge or rashes FEMALE GENITALIA:  External genitalia shows some  hypopigmentation and lichenification between vagina and anus External urethra normal Normal bimanual exam RECTAL: no rectal masses or hemorrhoids Musculoskeletal: Normal range of motion, no joint effusions. No gross deformities Neurological: he is alert and oriented to person, place, and time. No cranial nerve deficit. Coordination, balance, strength, speech and gait are normal.  Skin: Skin is warm and dry. No rash noted. No erythema.  Psychiatric: Patient has a normal mood and affect. behavior is normal. Judgment and thought content normal.  Recent Results (from the past 2160 hour(s))  POCT urinalysis dipstick     Status: Abnormal   Collection Time: 12/29/16 12:47 PM  Result Value Ref Range   Color, UA yellow    Clarity, UA clear    Glucose, UA neg    Bilirubin, UA neg    Ketones, UA neg    Spec Grav, UA 1.020    Blood, UA neg    pH, UA 6.0    Protein, UA neg    Urobilinogen, UA negative    Nitrite, UA neg    Leukocytes, UA small (1+) (A) Negative  Urine culture     Status: None   Collection Time: 12/29/16  1:52 PM  Result Value Ref Range   Organism ID, Bacteria NO GROWTH   COMPLETE METABOLIC PANEL WITH GFR     Status: None   Collection Time: 02/23/17 10:08 AM  Result Value Ref Range   Sodium 138 135 - 146 mmol/L   Potassium 4.8 3.5 - 5.3 mmol/L   Chloride 103 98 - 110 mmol/L   CO2 29 20 - 31 mmol/L   Glucose, Bld 93 65 - 99 mg/dL   BUN 19 7 - 25 mg/dL   Creat 0.75 0.50 - 1.05 mg/dL    Comment:   For patients > or = 56 years of age: The upper reference limit for Creatinine is approximately 13% higher for people identified as African-American.      Total Bilirubin 0.5 0.2 - 1.2 mg/dL   Alkaline Phosphatase 101 33 - 130 U/L   AST 15 10 - 35 U/L   ALT 20 6 - 29 U/L   Total Protein 7.3 6.1 - 8.1 g/dL   Albumin 4.2 3.6 - 5.1 g/dL   Calcium 9.5 8.6 - 10.4 mg/dL   GFR, Est African American >89 >=60 mL/min   GFR, Est Non African American 89 >=60 mL/min  CBC with  Differential/Platelet     Status: None   Collection Time: 02/23/17 10:08 AM  Result Value Ref Range   WBC 4.9 3.8 - 10.8 K/uL   RBC 4.32 3.80 - 5.10 MIL/uL   Hemoglobin 12.4 11.7 - 15.5 g/dL   HCT 38.5 35.0 - 45.0 %   MCV 89.1 80.0 - 100.0 fL   MCH 28.7 27.0 - 33.0 pg   MCHC 32.2 32.0 - 36.0 g/dL   RDW 13.3 11.0 -  15.0 %   Platelets 321 140 - 400 K/uL   MPV 9.8 7.5 - 12.5 fL   Neutro Abs 1,568 1,500 - 7,800 cells/uL   Lymphs Abs 2,891 850 - 3,900 cells/uL   Monocytes Absolute 245 200 - 950 cells/uL   Eosinophils Absolute 196 15 - 500 cells/uL   Basophils Absolute 0 0 - 200 cells/uL   Neutrophils Relative % 32 %   Lymphocytes Relative 59 %   Monocytes Relative 5 %   Eosinophils Relative 4 %   Basophils Relative 0 %   Smear Review Criteria for review not met   Lipid panel     Status: Abnormal   Collection Time: 02/23/17 10:08 AM  Result Value Ref Range   Cholesterol 199 <200 mg/dL   Triglycerides 63 <150 mg/dL   HDL 86 >50 mg/dL   Total CHOL/HDL Ratio 2.3 <5.0 Ratio   VLDL 13 <30 mg/dL   LDL Cholesterol 100 (H) <100 mg/dL  Hemoglobin A1c     Status: None   Collection Time: 02/23/17 10:08 AM  Result Value Ref Range   Hgb A1c MFr Bld 5.3 <5.7 %    Comment:   For the purpose of screening for the presence of diabetes:   <5.7%       Consistent with the absence of diabetes 5.7-6.4 %   Consistent with increased risk for diabetes (prediabetes) >=6.5 %     Consistent with diabetes   This assay result is consistent with a decreased risk of diabetes.   Currently, no consensus exists regarding use of hemoglobin A1c for diagnosis of diabetes in children.   According to American Diabetes Association (ADA) guidelines, hemoglobin A1c <7.0% represents optimal control in non-pregnant diabetic patients. Different metrics may apply to specific patient populations. Standards of Medical Care in Diabetes (ADA).      Mean Plasma Glucose 105 mg/dL  Insulin, fasting     Status: None    Collection Time: 02/23/17 10:08 AM  Result Value Ref Range   Insulin fasting, serum 7.8 2.0 - 19.6 uIU/mL    Comment:   This insulin assay shows strong cross-reactivity for some insulin analogs (lispro, aspart, and glargine) and much lower cross-reactivity with others (detemir, glulisine).   Stimulated Insulin reference intervals were established using the Siemens Immulite assay. These values are provided for general guidance only.   VITAMIN D 25 Hydroxy (Vit-D Deficiency, Fractures)     Status: Abnormal   Collection Time: 02/23/17 10:08 AM  Result Value Ref Range   Vit D, 25-Hydroxy 29 (L) 30 - 100 ng/mL    Comment: Vitamin D Status           25-OH Vitamin D        Deficiency                <20 ng/mL        Insufficiency         20 - 29 ng/mL        Optimal             > or = 30 ng/mL   For 25-OH Vitamin D testing on patients on D2-supplementation and patients for whom quantitation of D2 and D3 fractions is required, the QuestAssureD 25-OH VIT D, (D2,D3), LC/MS/MS is recommended: order code [id number] (patients > 2 yrs).      PHQ2/9: Depression screen [facility] 2/9 02/28/2017 12/29/2016 02/10/2016  Decreased Interest 0 0 0  Down, Depressed, Hopeless 0 0 2  PHQ - 2 Score 0  0 2  Altered sleeping - 3 2  Tired, decreased energy - 2 2  Change in appetite - 0 0  Feeling bad or failure about yourself  - 0 0  Trouble concentrating - 1 0  Moving slowly or fidgety/restless - 0 0  Suicidal thoughts - 0 0  PHQ-9 Score - 6 6     Fall Risk: Fall Risk  02/28/2017 02/10/2016  Falls in the past year? No No     Functional Status Survey: Is the patient deaf or have difficulty hearing?: No Does the patient have difficulty seeing, even when wearing glasses/contacts?: No Does the patient have difficulty concentrating, remembering, or making decisions?: No Does the patient have difficulty walking or climbing stairs?: No Does the patient have difficulty dressing or bathing?: No Does the patient  have difficulty doing errands alone such as visiting a doctor's office or shopping?: No   Assessment & Plan  1. Well woman exam  Discussed importance of 150 minutes of physical activity weekly, eat two servings of fish weekly, eat one serving of tree nuts ( cashews, pistachios, pecans, almonds.Marland Kitchen) every other day, eat 6 servings of fruit/vegetables daily and drink plenty of water and avoid sweet beverages.   2. Breast cancer screening  We will schedule it for her  3. Lower abdominal pain  Going on for months now, not associated with change in bowel movements, fever or chills, she has her ovaries, we will check pelvic US to see if normal ovaries, colonoscopy is up to date.  - US Pelvis Complete; Future If normal pelvic US, we will send her back to have repeat colonoscopy   4. Mixed incontinence urge and stress  She will try cutting down on caffeine

## 2017-03-07 ENCOUNTER — Ambulatory Visit
Admission: RE | Admit: 2017-03-07 | Discharge: 2017-03-07 | Disposition: A | Discharge status: Home / Self Care | Payer: BLUE CROSS/BLUE SHIELD | Source: Ambulatory Visit | Attending: Family Medicine | Admitting: Family Medicine

## 2017-03-07 DIAGNOSIS — R1030 Lower abdominal pain, unspecified: Secondary | ICD-10-CM | POA: Diagnosis not present

## 2017-03-07 DIAGNOSIS — N854 Malposition of uterus: Secondary | ICD-10-CM | POA: Diagnosis not present

## 2017-03-07 DIAGNOSIS — D259 Leiomyoma of uterus, unspecified: Secondary | ICD-10-CM | POA: Insufficient documentation

## 2017-03-07 DIAGNOSIS — Z1231 Encounter for screening mammogram for malignant neoplasm of breast: Secondary | ICD-10-CM | POA: Diagnosis not present

## 2017-03-07 DIAGNOSIS — N3946 Mixed incontinence: Secondary | ICD-10-CM

## 2017-03-09 DIAGNOSIS — H40003 Preglaucoma, unspecified, bilateral: Secondary | ICD-10-CM | POA: Diagnosis not present

## 2017-03-30 DIAGNOSIS — H40003 Preglaucoma, unspecified, bilateral: Secondary | ICD-10-CM | POA: Diagnosis not present

## 2017-03-30 LAB — HM DIABETES EYE EXAM

## 2017-04-04 ENCOUNTER — Encounter: Payer: Self-pay | Admitting: Family Medicine

## 2017-04-26 ENCOUNTER — Telehealth: Payer: Self-pay | Admitting: Family Medicine

## 2017-04-26 NOTE — Telephone Encounter (Signed)
Pt saw her test results and would like to know what are her options. The pain is more now than what it was before. Please return call [phone]

## 2017-04-27 NOTE — Telephone Encounter (Signed)
We can refer her to GI for lower abdominal pain, we can also place a referral to gyn because of fibroids, but not sure if that is the cause of her pain

## 2017-04-27 NOTE — Telephone Encounter (Signed)
Patient will keep monitoring her pain and let us know if she would like a referral to GYN.

## 2017-09-26 DIAGNOSIS — H40003 Preglaucoma, unspecified, bilateral: Secondary | ICD-10-CM | POA: Diagnosis not present

## 2017-12-01 ENCOUNTER — Encounter: Payer: Self-pay | Admitting: Family Medicine

## 2017-12-01 ENCOUNTER — Ambulatory Visit: Payer: BLUE CROSS/BLUE SHIELD | Admitting: Family Medicine

## 2017-12-01 VITALS — BP 130/90 | HR 83 | Resp 12 | Ht 62.0 in | Wt 169.7 lb

## 2017-12-01 DIAGNOSIS — R7303 Prediabetes: Secondary | ICD-10-CM | POA: Diagnosis not present

## 2017-12-01 DIAGNOSIS — R109 Unspecified abdominal pain: Secondary | ICD-10-CM | POA: Diagnosis not present

## 2017-12-01 DIAGNOSIS — F320 Major depressive disorder, single episode, mild: Secondary | ICD-10-CM | POA: Diagnosis not present

## 2017-12-01 DIAGNOSIS — R110 Nausea: Secondary | ICD-10-CM | POA: Diagnosis not present

## 2017-12-01 DIAGNOSIS — R634 Abnormal weight loss: Secondary | ICD-10-CM

## 2017-12-01 DIAGNOSIS — F439 Reaction to severe stress, unspecified: Secondary | ICD-10-CM

## 2017-12-01 DIAGNOSIS — R350 Frequency of micturition: Secondary | ICD-10-CM | POA: Diagnosis not present

## 2017-12-01 DIAGNOSIS — I10 Essential (primary) hypertension: Secondary | ICD-10-CM

## 2017-12-01 DIAGNOSIS — F32A Depression, unspecified: Secondary | ICD-10-CM

## 2017-12-01 LAB — CBC WITH DIFFERENTIAL/PLATELET
BASOS PCT: 0.5 %
Basophils Absolute: 32 cells/uL (ref 0–200)
EOS PCT: 2.4 %
Eosinophils Absolute: 154 cells/uL (ref 15–500)
HCT: 37.3 % (ref 35.0–45.0)
HEMOGLOBIN: 12.4 g/dL (ref 11.7–15.5)
Lymphs Abs: 3520 cells/uL (ref 850–3900)
MCH: 29 pg (ref 27.0–33.0)
MCHC: 33.2 g/dL (ref 32.0–36.0)
MCV: 87.1 fL (ref 80.0–100.0)
MONOS PCT: 6 %
MPV: 10.1 fL (ref 7.5–12.5)
NEUTROS ABS: 2310 {cells}/uL (ref 1500–7800)
Neutrophils Relative %: 36.1 %
PLATELETS: 405 10*3/uL — AB (ref 140–400)
RBC: 4.28 10*6/uL (ref 3.80–5.10)
RDW: 11.9 % (ref 11.0–15.0)
TOTAL LYMPHOCYTE: 55 %
WBC mixed population: 384 cells/uL (ref 200–950)
WBC: 6.4 10*3/uL (ref 3.8–10.8)

## 2017-12-01 LAB — POCT URINALYSIS DIPSTICK
Bilirubin, UA: NEGATIVE
Glucose, UA: NEGATIVE
KETONES UA: NEGATIVE
Leukocytes, UA: NEGATIVE
NITRITE UA: NEGATIVE
PH UA: 5 (ref 5.0–8.0)
Protein, UA: NORMAL
RBC UA: NEGATIVE
Spec Grav, UA: 1.02 (ref 1.010–1.025)
UROBILINOGEN UA: NEGATIVE U/dL — AB

## 2017-12-01 LAB — COMPLETE METABOLIC PANEL WITH GFR
AG RATIO: 1.6 (calc) (ref 1.0–2.5)
ALKALINE PHOSPHATASE (APISO): 87 U/L (ref 33–130)
ALT: 19 U/L (ref 6–29)
AST: 14 U/L (ref 10–35)
Albumin: 4.5 g/dL (ref 3.6–5.1)
BUN: 17 mg/dL (ref 7–25)
CALCIUM: 9.6 mg/dL (ref 8.6–10.4)
CHLORIDE: 104 mmol/L (ref 98–110)
CO2: 29 mmol/L (ref 20–32)
Creat: 0.86 mg/dL (ref 0.50–1.05)
GFR, Est African American: 87 mL/min/{1.73_m2} (ref >=60)
GFR, Est Non African American: 75 mL/min/{1.73_m2} (ref >=60)
Globulin: 2.8 g/dL (calc) (ref 1.9–3.7)
Glucose, Bld: 83 mg/dL (ref 65–99)
POTASSIUM: 4.6 mmol/L (ref 3.5–5.3)
Sodium: 138 mmol/L (ref 135–146)
Total Bilirubin: 0.4 mg/dL (ref 0.2–1.2)
Total Protein: 7.3 g/dL (ref 6.1–8.1)

## 2017-12-01 LAB — POCT GLYCOSYLATED HEMOGLOBIN (HGB A1C): Hemoglobin A1C: 5.5

## 2017-12-01 NOTE — Progress Notes (Signed)
Name: Margaret Kramer   MRN: [id number]    DOB: 1961/04/04   Date:12/01/2017       Progress Note  Subjective  Chief Complaint  Chief Complaint  Patient presents with  . Follow-up  . Nausea    HPI  Major Depression: still missed her father that died over one year ago. She now worries about her mother living with her brothers ( they are all spoiled ), she is now worried about her dog that is dying, Her PHQ9 is 7 but she refuses medication  Pre-diabetes: she los another 6  lbs since last visit, but eating healthier, she denies polyphagia, polydipsia but she has polyuria.    HTN: never on medication, her bp this week has been elevated in the 130's/90's, she wants to hold off on medication, she wants to continue to monitor for now. She states at home bp is usually 125/85  Recurrent abdominal pain:since Feb she comes in with intermittent and hard to explain abdominal symptoms, now losing weight we will check a CT abdomen and pelvis. She had some diarrhea about one month ago and is also having nausea intermittently for the past month, we need to rule out ovarian cancer, if negative CT refer to GI  Patient Active Problem List   Diagnosis Date Noted  . Mild depression ([place]) 12/29/2016  . Other insomnia 12/29/2016  . Cardiac murmur 02/10/2016  . Hypertension, benign 02/10/2016  . History of syncope 02/10/2016  . Prediabetes 02/10/2016  . Increased pressure in the eye 02/10/2016  . Vitamin D deficiency 02/10/2016  . Obesity (BMI 30.0-34.9) 02/10/2016    Past Surgical History:  Procedure Laterality Date  . ganglion cyst removal Right     Family History  Problem Relation Age of Onset  . Diabetes Mother   . Hypothyroidism Mother   . Alcoholism Father   . Dementia Father   . Cancer Brother 20       lung cancer  . Breast cancer Maternal Aunt 80  . Breast cancer Cousin 37       mat cousin    Social History   Socioeconomic History  . Marital status: Single    Spouse name:  Not on file  . Number of children: Not on file  . Years of education: Not on file  . Highest education level: Not on file  Social Needs  . Financial resource strain: Not on file  . Food insecurity - worry: Not on file  . Food insecurity - inability: Not on file  . Transportation needs - medical: Not on file  . Transportation needs - non-medical: Not on file  Occupational History  . Not on file  Tobacco Use  . Smoking status: Never Smoker  . Smokeless tobacco: Never Used  Substance and Sexual Activity  . Alcohol use: Yes    Alcohol/week: 0.0 oz    Comment: occasionally  . Drug use: No  . Sexual activity: No  Other Topics Concern  . Not on file  Social History Narrative  . Not on file     Current Outpatient Medications:  .  Cholecalciferol (VITAMIN D) 2000 units CAPS, Take 1 capsule by mouth daily., Disp: , Rfl:  .  ibuprofen (ADVIL,MOTRIN) 800 MG tablet, Take 1 tablet (800 mg total) by mouth 3 (three) times daily. (Patient not taking: Reported on 12/01/2017), Disp: 30 tablet, Rfl: 0  No Known Allergies   ROS  Constitutional: Negative for fever , positive for mild  weight change.  Respiratory: Negative  for cough and shortness of breath.   Cardiovascular: Negative for chest pain or palpitations.  Gastrointestinal: Positive  For intermittent  abdominal pain, no bowel changes - currently .  Musculoskeletal: Negative for gait problem or joint swelling.  Skin: Negative for rash.  Neurological: Negative for dizziness or headache.  No other specific complaints in a complete review of systems (except as listed in HPI above).  Objective  Vitals:   12/01/17 1357  BP: 130/90  Pulse: 83  Resp: 12  SpO2: 98%  Weight: 169 lb 11.2 oz (77 kg)  Height: 5\' 2"  (1.575 m)    Body mass index is 31.04 kg/m.  Physical Exam  Constitutional: Patient appears well-developed and well-nourished. Obese  No distress.  HEENT: head atraumatic, normocephalic, pupils equal and reactive to  light,neck supple, throat within normal limits Cardiovascular: Normal rate, regular rhythm and normal heart sounds.  No murmur heard. No BLE edema. Pulmonary/Chest: Effort normal and breath sounds normal. No respiratory distress. Abdominal: Soft.  There is no tenderness, no palpable masses  Psychiatric: Patient has a normal mood and affect. behavior is normal. Judgment and thought content normal.  Recent Results (from the past 2160 hour(s))  POCT Urinalysis Dipstick     Status: Abnormal   Collection Time: 12/01/17  2:06 PM  Result Value Ref Range   Color, UA yellow    Clarity, UA clear    Glucose, UA negative    Bilirubin, UA negative    Ketones, UA negative    Spec Grav, UA 1.020 1.010 - 1.025   Blood, UA negative    pH, UA 5.0 5.0 - 8.0   Protein, UA normal    Urobilinogen, UA negative (A) 0.2 or 1.0 E.U./dL   Nitrite, UA negative    Leukocytes, UA Negative Negative   Appearance clear    Odor none   POCT HgB A1C     Status: Normal   Collection Time: 12/01/17  2:08 PM  Result Value Ref Range   Hemoglobin A1C 5.5      PHQ2/9: Depression screen [facility] 2/9 02/28/2017 12/29/2016 02/10/2016  Decreased Interest 0 0 0  Down, Depressed, Hopeless 0 0 2  PHQ - 2 Score 0 0 2  Altered sleeping - 3 2  Tired, decreased energy - 2 2  Change in appetite - 0 0  Feeling bad or failure about yourself  - 0 0  Trouble concentrating - 1 0  Moving slowly or fidgety/restless - 0 0  Suicidal thoughts - 0 0  PHQ-9 Score - 6 6     Fall Risk: Fall Risk  12/01/2017 02/28/2017 02/10/2016  Falls in the past year? No No No     Functional Status Survey: Is the patient deaf or have difficulty hearing?: No Does the patient have difficulty seeing, even when wearing glasses/contacts?: No Does the patient have difficulty concentrating, remembering, or making decisions?: No Does the patient have difficulty walking or climbing stairs?: No Does the patient have difficulty dressing or bathing?: No Does the  patient have difficulty doing errands alone such as visiting a doctor's office or shopping?: No   Assessment & Plan  1. Prediabetes  - POCT HgB A1C  2. Frequent urination  - POCT Urinalysis Dipstick - urine culture  3. Mild depression ([place])  Not on medication at this time  4. Hypertension, benign  DBP is elevated, she has not been taking bp medication  5. Stress  Worried about her dog of 15 years that is actively dying  6. Nausea  - COMPLETE METABOLIC PANEL WITH GFR - CBC with Differential/Platelet  7. Abdominal cramping  And since Feb 2018 different complaints of abdominal pain, different descriptions  8. Abnormal weight loss  - CT Abdomen Pelvis Wo Contrast; Future

## 2017-12-02 LAB — URINE CULTURE
MICRO NUMBER:: 90079173
RESULT: NO GROWTH
SPECIMEN QUALITY: ADEQUATE

## 2017-12-05 ENCOUNTER — Telehealth: Payer: Self-pay | Admitting: Family Medicine

## 2017-12-05 NOTE — Telephone Encounter (Signed)
Copied from [place]. Topic: Inquiry >> Dec 05, 2017  3:52 PM Corie Chiquito, [place] wrote: Reason for CRM: Patient calling because she would like to know if she should set up her own CT Scan or would the office be doing it. If someone could give her a call back about this at

## 2017-12-05 NOTE — Telephone Encounter (Signed)
Patient was informed that she has been scheduled to have her CT on 12/14/17 @ 11:30am the [facility]. Patient was instructed to arrive mins early and not to have anything to eat or drink 4 hours prior. She was also informed that she needed to pick up a prep kit prior to imaging.   Patient expressed verbal understanding and said thanks

## 2017-12-12 NOTE — Addendum Note (Signed)
Addended by: Chilton Greathouse on: 12/12/2017 02:37 PM   Modules accepted: Orders

## 2017-12-12 NOTE — Addendum Note (Signed)
Addended by: Steele Sizer F on: 12/12/2017 02:40 PM   Modules accepted: Orders

## 2017-12-14 ENCOUNTER — Ambulatory Visit
Admission: RE | Admit: 2017-12-14 | Discharge: 2017-12-14 | Disposition: A | Discharge status: Home / Self Care | Payer: BLUE CROSS/BLUE SHIELD | Source: Ambulatory Visit | Attending: Family Medicine | Admitting: Family Medicine

## 2017-12-14 DIAGNOSIS — N200 Calculus of kidney: Secondary | ICD-10-CM | POA: Insufficient documentation

## 2017-12-14 DIAGNOSIS — D259 Leiomyoma of uterus, unspecified: Secondary | ICD-10-CM | POA: Diagnosis not present

## 2017-12-14 DIAGNOSIS — K5730 Diverticulosis of large intestine without perforation or abscess without bleeding: Secondary | ICD-10-CM | POA: Insufficient documentation

## 2017-12-14 DIAGNOSIS — R634 Abnormal weight loss: Secondary | ICD-10-CM | POA: Diagnosis present

## 2017-12-14 DIAGNOSIS — R109 Unspecified abdominal pain: Secondary | ICD-10-CM | POA: Diagnosis not present

## 2017-12-14 MED ORDER — IOPAMIDOL (ISOVUE-300) INJECTION 61%
100.0000 mL | Freq: Once | INTRAVENOUS | Status: AC | PRN
  Administered 2017-12-14: 100 mL via INTRAVENOUS

## 2017-12-16 ENCOUNTER — Encounter: Payer: Self-pay | Admitting: Family Medicine

## 2018-01-02 ENCOUNTER — Encounter: Payer: Self-pay | Admitting: Family Medicine

## 2018-01-02 ENCOUNTER — Ambulatory Visit: Payer: BLUE CROSS/BLUE SHIELD | Admitting: Family Medicine

## 2018-01-02 VITALS — BP 136/78 | HR 98 | Temp 98.7°F | Resp 18 | Ht 62.0 in | Wt 176.6 lb

## 2018-01-02 DIAGNOSIS — R109 Unspecified abdominal pain: Secondary | ICD-10-CM

## 2018-01-02 DIAGNOSIS — D75839 Thrombocytosis, unspecified: Secondary | ICD-10-CM

## 2018-01-02 DIAGNOSIS — D473 Essential (hemorrhagic) thrombocythemia: Secondary | ICD-10-CM

## 2018-01-02 NOTE — Patient Instructions (Signed)
Irritable Bowel Syndrome, Adult Irritable bowel syndrome (IBS) is not one specific disease. It is a group of symptoms that affects the organs responsible for digestion (gastrointestinal or GI tract). To regulate how your GI tract works, your body sends signals back and forth between your intestines and your brain. If you have IBS, there may be a problem with these signals. As a result, your GI tract does not function normally. Your intestines may become more sensitive and overreact to certain things. This is especially true when you eat certain foods or when you are under stress. There are four types of IBS. These may be determined based on the consistency of your stool:  IBS with diarrhea.  IBS with constipation.  Mixed IBS.  Unsubtyped IBS.  It is important to know which type of IBS you have. Some treatments are more likely to be helpful for certain types of IBS. What are the causes? The exact cause of IBS is not known. What increases the risk? You may have a higher risk of IBS if:  You are a woman.  You are younger than 58 years old.  You have a family history of IBS.  You have mental health problems.  You have had bacterial infection of your GI tract.  What are the signs or symptoms? Symptoms of IBS vary from person to person. The main symptom is abdominal pain or discomfort. Additional symptoms usually include one or more of the following:  Diarrhea, constipation, or both.  Abdominal swelling or bloating.  Feeling full or sick after eating a small or regular-size meal.  Frequent gas.  Mucus in the stool.  A feeling of having more stool left after a bowel movement.  Symptoms tend to come and go. They may be associated with stress, psychiatric conditions, or nothing at all. How is this diagnosed? There is no specific test to diagnose IBS. Your health care provider will make a diagnosis based on a physical exam, medical history, and your symptoms. You may have other  tests to rule out other conditions that may be causing your symptoms. These may include:  Blood tests.  X-rays.  CT scan.  Endoscopy and colonoscopy. This is a test in which your GI tract is viewed with a long, thin, flexible tube.  How is this treated? There is no cure for IBS, but treatment can help relieve symptoms. IBS treatment often includes:  Changes to your diet, such as: ? Eating more fiber. ? Avoiding foods that cause symptoms. ? Drinking more water. ? Eating regular, medium-sized portioned meals.  Medicines. These may include: ? Fiber supplements if you have constipation. ? Medicine to control diarrhea (antidiarrheal medicines). ? Medicine to help control muscle spasms in your GI tract (antispasmodic medicines). ? Medicines to help with any mental health issues, such as antidepressants or tranquilizers.  Therapy. ? Talk therapy may help with anxiety, depression, or other mental health issues that can make IBS symptoms worse.  Stress reduction. ? Managing your stress can help keep symptoms under control.  Follow these instructions at home:  Take medicines only as directed by your health care provider.  Eat a healthy diet. ? Avoid foods and drinks with added sugar. ? Include more whole grains, fruits, and vegetables gradually into your diet. This may be especially helpful if you have IBS with constipation. ? Avoid any foods and drinks that make your symptoms worse. These may include dairy products and caffeinated or carbonated drinks. ? Do not eat large meals. ? Drink enough   fluid to keep your urine clear or pale yellow.  Exercise regularly. Ask your health care provider for recommendations of good activities for you.  Keep all follow-up visits as directed by your health care provider. This is important. Contact a health care provider if:  You have constant pain.  You have trouble or pain with swallowing.  You have worsening diarrhea. Get help right away  if:  You have severe and worsening abdominal pain.  You have diarrhea and: ? You have a rash, stiff neck, or severe headache. ? You are irritable, sleepy, or difficult to awaken. ? You are weak, dizzy, or extremely thirsty.  You have bright red blood in your stool or you have black tarry stools.  You have unusual abdominal swelling that is painful.  You vomit continuously.  You vomit blood (hematemesis).  You have both abdominal pain and a fever. This information is not intended to replace advice given to you by your health care provider. Make sure you discuss any questions you have with your health care provider. Document Released: 10/31/2005 Document Revised: 04/01/2016 Document Reviewed: 07/18/2014 Elsevier Interactive Patient Education  2018 [employer]  

## 2018-01-02 NOTE — Progress Notes (Signed)
Name: Margaret Kramer   MRN: [id number]    DOB: 07/18/1961   Date:01/02/2018       Progress Note  Subjective  Chief Complaint  Chief Complaint  Patient presents with  . Follow-up    1 month F/U  . Abdominal Pain    CT results and has put back on 6 pounds back on. Has been bloated and nauseous feeling.  Courtney Paris    Had to put dog down and misses him but glad he does not have to suffer any more    HPI  IBS: she told me today seen by Dr. Candace Cruise about 10 years ago with similar symptoms of abdominal pain, cramping like. She has bowel movements after every meal. No blood in stools or mucus. She had CT abdomen and showed very small stone on right kidney also has diverticulitis.   Grieving: she does not want medication at this time  Patient Active Problem List   Diagnosis Date Noted  . Mild depression ([place]) 12/29/2016  . Other insomnia 12/29/2016  . Cardiac murmur 02/10/2016  . Hypertension, benign 02/10/2016  . History of syncope 02/10/2016  . Prediabetes 02/10/2016  . Increased pressure in the eye 02/10/2016  . Vitamin D deficiency 02/10/2016  . Obesity (BMI 30.0-34.9) 02/10/2016    Past Surgical History:  Procedure Laterality Date  . ganglion cyst removal Right     Family History  Problem Relation Age of Onset  . Diabetes Mother   . Hypothyroidism Mother   . Alcoholism Father   . Dementia Father   . Cancer Brother 32       lung cancer  . Breast cancer Maternal Aunt 80  . Breast cancer Cousin 28       mat cousin    Social History   Socioeconomic History  . Marital status: Single    Spouse name: Not on file  . Number of children: Not on file  . Years of education: Not on file  . Highest education level: Not on file  Social Needs  . Financial resource strain: Not on file  . Food insecurity - worry: Not on file  . Food insecurity - inability: Not on file  . Transportation needs - medical: Not on file  . Transportation needs - non-medical: Not on file   Occupational History  . Not on file  Tobacco Use  . Smoking status: Never Smoker  . Smokeless tobacco: Never Used  Substance and Sexual Activity  . Alcohol use: Yes    Alcohol/week: 0.0 oz    Comment: occasionally  . Drug use: No  . Sexual activity: No  Other Topics Concern  . Not on file  Social History Narrative  . Not on file     Current Outpatient Medications:  .  Cholecalciferol (VITAMIN D) 2000 units CAPS, Take 1 capsule by mouth daily., Disp: , Rfl:  .  ibuprofen (ADVIL,MOTRIN) 800 MG tablet, Take 1 tablet (800 mg total) by mouth 3 (three) times daily., Disp: 30 tablet, Rfl: 0  No Known Allergies   ROS  Constitutional: Negative for fever positive for mild  weight change.  Respiratory: Negative for cough and shortness of breath.   Cardiovascular: Negative for chest pain or palpitations.  Gastrointestinal: Positive for abdominal pain over the past year, no change of bowel changes.  Musculoskeletal: Negative for gait problem or joint swelling.  Skin: Negative for rash.  Neurological: Negative for dizziness or headache.  No other specific complaints in a complete review of systems (  except as listed in HPI above).  Objective  Vitals:   01/02/18 1448  BP: 136/78  Pulse: 98  Resp: 18  Temp: 98.7 F (37.1 C)  TempSrc: Oral  SpO2: 98%  Weight: 176 lb 9.6 oz (80.1 kg)  Height: 5\' 2"  (1.575 m)    Body mass index is 32.3 kg/m.  Physical Exam  Constitutional: Patient appears well-developed and well-nourished. Obese  No distress.  HEENT: head atraumatic, normocephalic, pupils equal and reactive to light,  neck supple, throat within normal limits Cardiovascular: Normal rate, regular rhythm and normal heart sounds.  No murmur heard. No BLE edema. Pulmonary/Chest: Effort normal and breath sounds normal. No respiratory distress. Abdominal: Soft.  There is  Tenderness lower abdomen, no rebound, no guarding  Psychiatric: Patient has a normal mood and affect. behavior  is normal. Judgment and thought content normal.   Recent Results (from the past 2160 hour(s))  POCT Urinalysis Dipstick     Status: Abnormal   Collection Time: 12/01/17  2:06 PM  Result Value Ref Range   Color, UA yellow    Clarity, UA clear    Glucose, UA negative    Bilirubin, UA negative    Ketones, UA negative    Spec Grav, UA 1.020 1.010 - 1.025   Blood, UA negative    pH, UA 5.0 5.0 - 8.0   Protein, UA normal    Urobilinogen, UA negative (A) 0.2 or 1.0 E.U./dL   Nitrite, UA negative    Leukocytes, UA Negative Negative   Appearance clear    Odor none   POCT HgB A1C     Status: Normal   Collection Time: 12/01/17  2:08 PM  Result Value Ref Range   Hemoglobin A1C 5.5   COMPLETE METABOLIC PANEL WITH GFR     Status: None   Collection Time: 12/01/17  3:07 PM  Result Value Ref Range   Glucose, Bld 83 65 - 99 mg/dL    Comment: .            Fasting reference interval .    BUN 17 7 - 25 mg/dL   Creat 0.86 0.50 - 1.05 mg/dL    Comment: For patients >7 years of age, the reference limit for Creatinine is approximately 13% higher for people identified as African-American. .    GFR, Est Non African American 75 > OR = 60 mL/min/1.90m2   GFR, Est African American 87 > OR = 60 mL/min/1.34m2   BUN/Creatinine Ratio NOT APPLICABLE 6 - 22 (calc)   Sodium 138 135 - 146 mmol/L   Potassium 4.6 3.5 - 5.3 mmol/L   Chloride 104 98 - 110 mmol/L   CO2 29 20 - 32 mmol/L   Calcium 9.6 8.6 - 10.4 mg/dL   Total Protein 7.3 6.1 - 8.1 g/dL   Albumin 4.5 3.6 - 5.1 g/dL   Globulin 2.8 1.9 - 3.7 g/dL (calc)   AG Ratio 1.6 1.0 - 2.5 (calc)   Total Bilirubin 0.4 0.2 - 1.2 mg/dL   Alkaline phosphatase (APISO) 87 33 - 130 U/L   AST 14 10 - 35 U/L   ALT 19 6 - 29 U/L  CBC with Differential/Platelet     Status: Abnormal   Collection Time: 12/01/17  3:07 PM  Result Value Ref Range   WBC 6.4 3.8 - 10.8 Thousand/uL   RBC 4.28 3.80 - 5.10 Million/uL   Hemoglobin 12.4 11.7 - 15.5 g/dL   HCT 37.3  35.0 - 45.0 %   MCV  87.1 80.0 - 100.0 fL   MCH 29.0 27.0 - 33.0 pg   MCHC 33.2 32.0 - 36.0 g/dL   RDW 11.9 11.0 - 15.0 %   Platelets 405 (H) 140 - 400 Thousand/uL   MPV 10.1 7.5 - 12.5 fL   Neutro Abs 2,310 1,500 - 7,800 cells/uL   Lymphs Abs 3,520 850 - 3,900 cells/uL   WBC mixed population 384 200 - 950 cells/uL   Eosinophils Absolute 154 15 - 500 cells/uL   Basophils Absolute 32 0 - 200 cells/uL   Neutrophils Relative % 36.1 %   Total Lymphocyte 55.0 %   Monocytes Relative 6.0 %   Eosinophils Relative 2.4 %   Basophils Relative 0.5 %  Urine Culture     Status: None   Collection Time: 12/01/17  3:15 PM  Result Value Ref Range   MICRO NUMBER: [id number]    SPECIMEN QUALITY: ADEQUATE    Sample Source URINE    STATUS: FINAL    Result: No Growth       PHQ2/9: Depression screen [facility] 2/9 12/01/2017 02/28/2017 12/29/2016 02/10/2016  Decreased Interest 1 0 0 0  Down, Depressed, Hopeless 2 0 0 2  PHQ - 2 Score 3 0 0 2  Altered sleeping 1 - 3 2  Tired, decreased energy 0 - 2 2  Change in appetite 1 - 0 0  Feeling bad or failure about yourself  1 - 0 0  Trouble concentrating 1 - 1 0  Moving slowly or fidgety/restless 0 - 0 0  Suicidal thoughts 0 - 0 0  PHQ-9 Score 7 - 6 6  Difficult doing work/chores Not difficult at all - - -     Fall Risk: Fall Risk  12/01/2017 02/28/2017 02/10/2016  Falls in the past year? No No No     Assessment & Plan  1. Abdominal cramping  - Ambulatory referral to Gastroenterology Discussed adding Nortriptyline but she wants to hold off for now  2. Thrombocytosis ([place])  Recheck next visit

## 2018-01-17 DIAGNOSIS — R109 Unspecified abdominal pain: Secondary | ICD-10-CM | POA: Diagnosis not present

## 2018-01-17 DIAGNOSIS — R252 Cramp and spasm: Secondary | ICD-10-CM | POA: Diagnosis not present

## 2018-03-07 DIAGNOSIS — M2011 Hallux valgus (acquired), right foot: Secondary | ICD-10-CM | POA: Diagnosis not present

## 2018-03-07 DIAGNOSIS — M2010 Hallux valgus (acquired), unspecified foot: Secondary | ICD-10-CM | POA: Insufficient documentation

## 2018-03-07 DIAGNOSIS — M79671 Pain in right foot: Secondary | ICD-10-CM | POA: Diagnosis not present

## 2018-03-07 DIAGNOSIS — M2012 Hallux valgus (acquired), left foot: Secondary | ICD-10-CM | POA: Diagnosis not present

## 2018-03-27 DIAGNOSIS — H40003 Preglaucoma, unspecified, bilateral: Secondary | ICD-10-CM | POA: Diagnosis not present

## 2018-04-19 ENCOUNTER — Other Ambulatory Visit: Payer: Self-pay | Admitting: Family Medicine

## 2018-04-19 DIAGNOSIS — Z1231 Encounter for screening mammogram for malignant neoplasm of breast: Secondary | ICD-10-CM

## 2018-04-25 ENCOUNTER — Encounter: Payer: Self-pay | Admitting: Emergency Medicine

## 2018-04-25 ENCOUNTER — Ambulatory Visit
Admission: EM | Admit: 2018-04-25 | Discharge: 2018-04-25 | Disposition: A | Discharge status: Home / Self Care | Payer: BLUE CROSS/BLUE SHIELD | Attending: Family Medicine | Admitting: Family Medicine

## 2018-04-25 ENCOUNTER — Ambulatory Visit
Admission: RE | Admit: 2018-04-25 | Discharge: 2018-04-25 | Disposition: A | Discharge status: Home / Self Care | Payer: BLUE CROSS/BLUE SHIELD | Source: Ambulatory Visit | Attending: Family Medicine | Admitting: Family Medicine

## 2018-04-25 ENCOUNTER — Other Ambulatory Visit: Payer: Self-pay

## 2018-04-25 DIAGNOSIS — J0100 Acute maxillary sinusitis, unspecified: Secondary | ICD-10-CM | POA: Diagnosis not present

## 2018-04-25 DIAGNOSIS — R300 Dysuria: Secondary | ICD-10-CM | POA: Diagnosis not present

## 2018-04-25 DIAGNOSIS — Z1231 Encounter for screening mammogram for malignant neoplasm of breast: Secondary | ICD-10-CM | POA: Diagnosis not present

## 2018-04-25 DIAGNOSIS — R109 Unspecified abdominal pain: Secondary | ICD-10-CM | POA: Diagnosis not present

## 2018-04-25 LAB — BASIC METABOLIC PANEL
ANION GAP: 12 (ref 5–15)
BUN: 17 mg/dL (ref 6–20)
CALCIUM: 9.3 mg/dL (ref 8.9–10.3)
CO2: 25 mmol/L (ref 22–32)
CREATININE: 0.87 mg/dL (ref 0.44–1.00)
Chloride: 99 mmol/L — ABNORMAL LOW (ref 101–111)
GFR calc Af Amer: >60 mL/min (ref >60)
GLUCOSE: 102 mg/dL — AB (ref 65–99)
Potassium: 4.4 mmol/L (ref 3.5–5.1)
Sodium: 136 mmol/L (ref 135–145)

## 2018-04-25 LAB — CBC WITH DIFFERENTIAL/PLATELET
BASOS ABS: 0.1 10*3/uL (ref 0–0.1)
Basophils Relative: 1 %
EOS PCT: 1 %
Eosinophils Absolute: 0.1 10*3/uL (ref 0–0.7)
HCT: 38.6 % (ref 35.0–47.0)
Hemoglobin: 13.2 g/dL (ref 12.0–16.0)
LYMPHS PCT: 26 %
Lymphs Abs: 2.9 10*3/uL (ref 1.0–3.6)
MCH: 29.4 pg (ref 26.0–34.0)
MCHC: 34.3 g/dL (ref 32.0–36.0)
MCV: 86 fL (ref 80.0–100.0)
MONO ABS: 0.7 10*3/uL (ref 0.2–0.9)
Monocytes Relative: 6 %
Neutro Abs: 7.5 10*3/uL — ABNORMAL HIGH (ref 1.4–6.5)
Neutrophils Relative %: 66 %
PLATELETS: 365 10*3/uL (ref 150–440)
RBC: 4.49 MIL/uL (ref 3.80–5.20)
RDW: 13.1 % (ref 11.5–14.5)
WBC: 11.2 10*3/uL — ABNORMAL HIGH (ref 3.6–11.0)

## 2018-04-25 LAB — URINALYSIS, COMPLETE (UACMP) WITH MICROSCOPIC
Glucose, UA: NEGATIVE mg/dL
Leukocytes, UA: NEGATIVE
Nitrite: NEGATIVE
Specific Gravity, Urine: 1.025 (ref 1.005–1.030)
pH: 6 (ref 5.0–8.0)

## 2018-04-25 MED ORDER — AMOXICILLIN-POT CLAVULANATE 875-125 MG PO TABS: 1.0000 | ORAL_TABLET | Freq: Two times a day (BID) | ORAL | 0 refills | Status: DC

## 2018-04-25 MED ORDER — FLUCONAZOLE 150 MG PO TABS: 150.0000 mg | ORAL_TABLET | Freq: Every day | ORAL | 0 refills | Status: DC

## 2018-04-25 NOTE — ED Triage Notes (Signed)
Patient c/o lower abdominal pain that started last Wed.  Patient denies N/V/D.  Patient also reports sneezing and nasal congestion and cough that started on Tuesday.

## 2018-04-25 NOTE — ED Provider Notes (Signed)
[facility] ____________________________________________  Time seen: Approximately 5:18 PM  I have reviewed the triage vital signs and the nursing notes.   HISTORY  Chief Complaint Abdominal Pain  HPI Margaret Kramer is a 57 y.o. female presenting for evaluation of complaints including nasal congestion and cough, dysuria as well as lower abdominal pain.  Patient reports nasal congestion, nasal drainage and cough began last Tuesday with some associated chills.  Denies sore throat.  Reports continues to eat and drink well.  Intermittent sinus pressure.  Unresolved with over-the-counter decongestants.  Also reports urinary frequency over the last several days, but states she was contributing to increase of fluids due to congestion sickness.  Also reports since yesterday lower abdominal pain and pressure sensation.  States discomfort is in bilateral lower abdomen.  States abdomen feels bloated.  Continues to have normal bowel movements, normal coloration.  No vomiting or diarrhea.  Does report history of diverticulosis, no diverticulitis.  Most recent CT in January 2019.  Denies known fevers, reports did have some chills.  No medications taken just prior to arrival.  Continues to eat and drink well.  Denies other aggravating or alleviating factors.  Denies vaginal discomfort or vaginal discharge. Denies chest pain, shortness of breath, extremity pain, or rash. Denies recent sickness. Denies recent antibiotic use.   Steele Sizer, MD: PCP   Past Medical History:  Diagnosis Date  . Allergy   . Hypertension     Patient Active Problem List   Diagnosis Date Noted  . Mild depression ([place]) 12/29/2016  . Other insomnia 12/29/2016  . Cardiac murmur 02/10/2016  . Hypertension, benign 02/10/2016  . History of syncope 02/10/2016  . Prediabetes 02/10/2016  . Increased pressure in the eye 02/10/2016  . Vitamin D deficiency 02/10/2016  . Obesity (BMI 30.0-34.9) 02/10/2016     Past Surgical History:  Procedure Laterality Date  . ganglion cyst removal Right      No current facility-administered medications for this encounter.   Current Outpatient Medications:  .  Cholecalciferol (VITAMIN D) 2000 units CAPS, Take 1 capsule by mouth daily., Disp: , Rfl:  .  Multiple Vitamin (MULTIVITAMIN) tablet, Take 1 tablet by mouth daily., Disp: , Rfl:  .  amoxicillin-clavulanate (AUGMENTIN) 875-125 MG tablet, Take 1 tablet by mouth every 12 (twelve) hours., Disp: 20 tablet, Rfl: 0 .  fluconazole (DIFLUCAN) 150 MG tablet, Take 1 tablet (150 mg total) by mouth daily. Take one pill orally, then Repeat in 72 hrs as needed., Disp: 2 tablet, Rfl: 0 .  ibuprofen (ADVIL,MOTRIN) 800 MG tablet, Take 1 tablet (800 mg total) by mouth 3 (three) times daily., Disp: 30 tablet, Rfl: 0  Allergies Patient has no known allergies.  Family History  Problem Relation Age of Onset  . Diabetes Mother   . Hypothyroidism Mother   . Alcoholism Father   . Dementia Father   . Cancer Brother 24       lung cancer  . Breast cancer Maternal Aunt 80  . Breast cancer Cousin 39       mat cousin    Social History Social History   Tobacco Use  . Smoking status: Never Smoker  . Smokeless tobacco: Never Used  Substance Use Topics  . Alcohol use: Yes    Alcohol/week: 0.0 oz    Comment: occasionally  . Drug use: No    Review of Systems Constitutional: As above.  ENT: AS above.  Cardiovascular: Denies chest pain. Respiratory: Denies shortness of breath. Gastrointestinal: As above.  Denies hard stools. Denies dark stools or blood in stools.  Genitourinary: Positive for dysuria. Musculoskeletal: Negative for back pain. Skin: Negative for rash. Neurological: Negative for headaches, focal weakness or numbness.    ____________________________________________   PHYSICAL EXAM:  VITAL SIGNS: ED Triage Vitals  Enc Vitals Group     BP 04/25/18 1548 131/85     Pulse Rate 04/25/18 1548 94      Resp 04/25/18 1548 16     Temp 04/25/18 1548 99.1 F (37.3 C)     Temp Source 04/25/18 1548 Oral     SpO2 04/25/18 1548 98 %     Weight 04/25/18 1545 180 lb (81.6 kg)     Height 04/25/18 1545 5\' 2"  (1.575 m)     Head Circumference --      Peak Flow --      Pain Score 04/25/18 1545 6     Pain Loc --      Pain Edu? --      Excl. in [place]? --     Constitutional: Alert and oriented. Well appearing and in no acute distress. Eyes: Conjunctivae are normal. Head: Atraumatic. No sinus tenderness to palpation. No swelling. No erythema.  Ears: no erythema, normal TMs bilaterally.   Nose:Nasal congestion with nasal turbinate erythema and mild edema.  Mouth/Throat: Mucous membranes are moist. Mild pharyngeal erythema. No tonsillar swelling or exudate.  Neck: No stridor.  No cervical spine tenderness to palpation. Hematological/Lymphatic/Immunilogical: No cervical lymphadenopathy. Cardiovascular: Normal rate, regular rhythm. Grossly normal heart sounds.  Good peripheral circulation. Respiratory: Normal respiratory effort.  No retractions. No wheezes, rales or rhonchi. Good air movement.  Gastrointestinal: Normal Bowel sounds.  Mild diffuse lower abdominal tenderness, more tender midline suprapubic and left suprapubic.  Abdomen soft.  Non-guarding.  No CVA tenderness. Musculoskeletal: Ambulatory with steady gait. No cervical, thoracic or lumbar tenderness to palpation. Neurologic:  Normal speech and language. No gait instability. Skin:  Skin appears warm, dry and intact. No rash noted. Psychiatric: Mood and affect are normal. Speech and behavior are normal.  ___________________________________________   LABS (all labs ordered are listed, but only abnormal results are displayed)  Labs Reviewed  CBC WITH DIFFERENTIAL/PLATELET - Abnormal; Notable for the following components:      Result Value   WBC 11.2 (*)    Neutro Abs 7.5 (*)    All other components within normal limits  BASIC METABOLIC  PANEL - Abnormal; Notable for the following components:   Chloride 99 (*)    Glucose, Bld 102 (*)    All other components within normal limits  URINALYSIS, COMPLETE (UACMP) WITH MICROSCOPIC - Abnormal; Notable for the following components:   Hgb urine dipstick TRACE (*)    Bilirubin Urine SMALL (*)    Ketones, ur TRACE (*)    Protein, ur TRACE (*)    Bacteria, UA FEW (*)    All other components within normal limits  URINE CULTURE   ____________________________________________ PROCEDURES Procedures    INITIAL IMPRESSION / ASSESSMENT AND PLAN / ED COURSE  Pertinent labs & imaging results that were available during my care of the patient were reviewed by me and considered in my medical decision making (see chart for details).  Very well-appearing patient.  No acute distress.  Recent sinus congestion and drainage, suspect recent viral illness, possible secondary sinusitis.  Abdominal discomfort since yesterday.  Continues with normal bowel movements.  Discussed multiple different differentials with patient including urinary tract infection, pelvic etiology, diverticulitis.  Urinalysis reviewed with  bacteria present also yeast present.  Discussed in detail with patient at this time will begin treatment with oral Augmentin, to treat the above concerns.  Will await urine culture.  Discussed proceeding to ER for further evaluation if pain worsens and likely need of imaging at that time.  Rx Diflucan also given.  Discussed indication, risks and benefits of medications with patient.  Discussed follow up with Primary care physician this week. Discussed follow up and return parameters including no resolution or any worsening concerns. Patient verbalized understanding and agreed to plan.   ____________________________________________   FINAL CLINICAL IMPRESSION(S) / ED DIAGNOSES  Final diagnoses:  Acute maxillary sinusitis, recurrence not specified  Abdominal pain, unspecified abdominal location   Dysuria     ED Discharge Orders        Ordered    amoxicillin-clavulanate (AUGMENTIN) 875-125 MG tablet  Every 12 hours     04/25/18 1725    fluconazole (DIFLUCAN) 150 MG tablet  Daily     04/25/18 1725       Note: This dictation was prepared with Dragon dictation along with smaller phrase technology. Any transcriptional errors that result from this process are unintentional.         Marylene Land, NP 04/25/18 1827

## 2018-04-25 NOTE — Discharge Instructions (Addendum)
Take medication as prescribed. Rest. Drink plenty of fluids.   Follow up with your primary care physician this week for follow up. Return to Urgent care as needed.  For increased abdominal pain or worsening concerns proceed directly to the emergency room.

## 2018-04-27 LAB — URINE CULTURE

## 2018-05-04 ENCOUNTER — Inpatient Hospital Stay
Admission: EM | Admit: 2018-05-04 | Discharge: 2018-05-10 | DRG: 392 | Disposition: A | Discharge status: Home / Self Care | Payer: BLUE CROSS/BLUE SHIELD | Attending: Surgery | Admitting: Surgery

## 2018-05-04 ENCOUNTER — Encounter: Payer: Self-pay | Admitting: Emergency Medicine

## 2018-05-04 ENCOUNTER — Emergency Department: Payer: BLUE CROSS/BLUE SHIELD

## 2018-05-04 DIAGNOSIS — I10 Essential (primary) hypertension: Secondary | ICD-10-CM | POA: Diagnosis present

## 2018-05-04 DIAGNOSIS — K5720 Diverticulitis of large intestine with perforation and abscess without bleeding: Secondary | ICD-10-CM | POA: Diagnosis not present

## 2018-05-04 DIAGNOSIS — K5732 Diverticulitis of large intestine without perforation or abscess without bleeding: Secondary | ICD-10-CM | POA: Diagnosis not present

## 2018-05-04 DIAGNOSIS — R1012 Left upper quadrant pain: Secondary | ICD-10-CM | POA: Diagnosis not present

## 2018-05-04 DIAGNOSIS — Z79899 Other long term (current) drug therapy: Secondary | ICD-10-CM | POA: Diagnosis not present

## 2018-05-04 DIAGNOSIS — N739 Female pelvic inflammatory disease, unspecified: Secondary | ICD-10-CM | POA: Diagnosis not present

## 2018-05-04 DIAGNOSIS — R109 Unspecified abdominal pain: Secondary | ICD-10-CM | POA: Diagnosis not present

## 2018-05-04 LAB — CBC
HCT: 36.8 % (ref 35.0–47.0)
Hemoglobin: 12.5 g/dL (ref 12.0–16.0)
MCH: 29.5 pg (ref 26.0–34.0)
MCHC: 33.9 g/dL (ref 32.0–36.0)
MCV: 87 fL (ref 80.0–100.0)
PLATELETS: 399 10*3/uL (ref 150–440)
RBC: 4.23 MIL/uL (ref 3.80–5.20)
RDW: 13.2 % (ref 11.5–14.5)
WBC: 9.5 10*3/uL (ref 3.6–11.0)

## 2018-05-04 LAB — COMPREHENSIVE METABOLIC PANEL
ALBUMIN: 4.1 g/dL (ref 3.5–5.0)
ALT: 37 U/L (ref 14–54)
AST: 27 U/L (ref 15–41)
Alkaline Phosphatase: 102 U/L (ref 38–126)
Anion gap: 8 (ref 5–15)
BUN: 13 mg/dL (ref 6–20)
CO2: 25 mmol/L (ref 22–32)
CREATININE: 0.65 mg/dL (ref 0.44–1.00)
Calcium: 8.9 mg/dL (ref 8.9–10.3)
Chloride: 105 mmol/L (ref 101–111)
GFR calc Af Amer: >60 mL/min (ref >60)
GFR calc non Af Amer: >60 mL/min (ref >60)
GLUCOSE: 116 mg/dL — AB (ref 65–99)
Potassium: 3.7 mmol/L (ref 3.5–5.1)
SODIUM: 138 mmol/L (ref 135–145)
Total Bilirubin: 0.3 mg/dL (ref 0.3–1.2)
Total Protein: 8 g/dL (ref 6.5–8.1)

## 2018-05-04 LAB — URINALYSIS, COMPLETE (UACMP) WITH MICROSCOPIC
Bacteria, UA: NONE SEEN
Bilirubin Urine: NEGATIVE
Glucose, UA: NEGATIVE mg/dL
Hgb urine dipstick: NEGATIVE
KETONES UR: NEGATIVE mg/dL
Leukocytes, UA: NEGATIVE
Nitrite: NEGATIVE
PH: 6 (ref 5.0–8.0)
Protein, ur: NEGATIVE mg/dL
SPECIFIC GRAVITY, URINE: 1.024 (ref 1.005–1.030)

## 2018-05-04 LAB — LIPASE, BLOOD: Lipase: 30 U/L (ref 11–51)

## 2018-05-04 MED ORDER — ONDANSETRON 4 MG PO TBDP
4.0000 mg | ORAL_TABLET | Freq: Once | ORAL | Status: AC | PRN
  Administered 2018-05-04: 4 mg via ORAL
  Filled 2018-05-04: qty 1

## 2018-05-04 MED ORDER — IOPAMIDOL (ISOVUE-300) INJECTION 61%
100.0000 mL | Freq: Once | INTRAVENOUS | Status: AC | PRN
  Administered 2018-05-04: 100 mL via INTRAVENOUS

## 2018-05-04 MED ORDER — SODIUM CHLORIDE 0.9 % IV BOLUS
1000.0000 mL | Freq: Once | INTRAVENOUS | Status: AC
  Administered 2018-05-04: 1000 mL via INTRAVENOUS

## 2018-05-04 MED ORDER — ONDANSETRON HCL 4 MG/2ML IJ SOLN
INTRAMUSCULAR | Status: AC
  Filled 2018-05-04: qty 2

## 2018-05-04 MED ORDER — ACETAMINOPHEN 650 MG RE SUPP: 650.0000 mg | Freq: Four times a day (QID) | RECTAL | Status: DC | PRN

## 2018-05-04 MED ORDER — FAMOTIDINE 20 MG PO TABS
20.0000 mg | ORAL_TABLET | Freq: Two times a day (BID) | ORAL | Status: DC
  Administered 2018-05-05 – 2018-05-09 (×11): 20 mg via ORAL
  Filled 2018-05-04 (×11): qty 1

## 2018-05-04 MED ORDER — ONDANSETRON HCL 4 MG/2ML IJ SOLN
4.0000 mg | Freq: Once | INTRAMUSCULAR | Status: AC
  Administered 2018-05-04: 4 mg via INTRAVENOUS
  Filled 2018-05-04: qty 2

## 2018-05-04 MED ORDER — ONDANSETRON HCL 4 MG/2ML IJ SOLN
4.0000 mg | Freq: Four times a day (QID) | INTRAMUSCULAR | Status: DC | PRN
  Administered 2018-05-04: 4 mg via INTRAVENOUS

## 2018-05-04 MED ORDER — SODIUM CHLORIDE 0.9 % IV SOLN
INTRAVENOUS | Status: DC
  Administered 2018-05-05 – 2018-05-06 (×2): via INTRAVENOUS

## 2018-05-04 MED ORDER — PIPERACILLIN-TAZOBACTAM 3.375 G IVPB
3.3750 g | Freq: Three times a day (TID) | INTRAVENOUS | Status: DC
  Administered 2018-05-05 – 2018-05-10 (×17): 3.375 g via INTRAVENOUS
  Filled 2018-05-04 (×17): qty 50

## 2018-05-04 MED ORDER — ACETAMINOPHEN 325 MG PO TABS
650.0000 mg | ORAL_TABLET | Freq: Four times a day (QID) | ORAL | Status: DC | PRN
  Administered 2018-05-06: 650 mg via ORAL
  Filled 2018-05-04 (×2): qty 2

## 2018-05-04 MED ORDER — MORPHINE SULFATE (PF) 4 MG/ML IV SOLN
4.0000 mg | Freq: Once | INTRAVENOUS | Status: AC
  Administered 2018-05-04: 4 mg via INTRAVENOUS
  Filled 2018-05-04: qty 1

## 2018-05-04 MED ORDER — MORPHINE SULFATE (PF) 4 MG/ML IV SOLN
4.0000 mg | INTRAVENOUS | Status: DC | PRN
  Administered 2018-05-04: 4 mg via INTRAVENOUS
  Filled 2018-05-04: qty 1

## 2018-05-04 MED ORDER — ENOXAPARIN SODIUM 40 MG/0.4ML ~~LOC~~ SOLN
40.0000 mg | SUBCUTANEOUS | Status: DC
  Administered 2018-05-05: 40 mg via SUBCUTANEOUS
  Filled 2018-05-04: qty 0.4

## 2018-05-04 MED ORDER — PIPERACILLIN-TAZOBACTAM 3.375 G IVPB 30 MIN
3.3750 g | Freq: Once | INTRAVENOUS | Status: AC
  Administered 2018-05-04: 3.375 g via INTRAVENOUS
  Filled 2018-05-04: qty 50

## 2018-05-04 MED ORDER — IOPAMIDOL (ISOVUE-300) INJECTION 61%
30.0000 mL | Freq: Once | INTRAVENOUS | Status: AC | PRN
  Administered 2018-05-04: 30 mL via ORAL

## 2018-05-04 NOTE — ED Notes (Signed)
Spoke with pt about wait times and what to expect next. Advised pt that I am available for further questions if needed.  

## 2018-05-04 NOTE — ED Provider Notes (Signed)
[facility] Emergency Department Provider Note  Time seen: 8:33 PM  I have reviewed the triage vital signs and the nursing notes.   HISTORY  Chief Complaint Abdominal Pain    HPI Margaret Kramer is a 57 y.o. female with a past medical history of hypertension, presents to the emergency department for left lower quadrant abdominal pain.  According to the patient for the past 10 days she has been experiencing left lower quadrant abdominal pain that began as mild dull aching pain but has progressed to more severe pain.  States currently around 8/10 dull aching pain, was a 10/10 earlier today.  Patient states she saw her doctor who thought she might of had a urinary tract infection prescribed her Augmentin however she was called back later by the doctor saying that her urine culture was negative.  Patient denies any fever, no vomiting.  Does state mild loose stool.  Does state nausea.  Has had diverticulitis in the past which this feels somewhat similar.   Past Medical History:  Diagnosis Date  . Allergy   . Hypertension     Patient Active Problem List   Diagnosis Date Noted  . Mild depression ([place]) 12/29/2016  . Other insomnia 12/29/2016  . Cardiac murmur 02/10/2016  . Hypertension, benign 02/10/2016  . History of syncope 02/10/2016  . Prediabetes 02/10/2016  . Increased pressure in the eye 02/10/2016  . Vitamin D deficiency 02/10/2016  . Obesity (BMI 30.0-34.9) 02/10/2016    Past Surgical History:  Procedure Laterality Date  . ganglion cyst removal Right     Prior to Admission medications   Medication Sig Start Date End Date Taking? Authorizing Provider  amoxicillin-clavulanate (AUGMENTIN) 875-125 MG tablet Take 1 tablet by mouth every 12 (twelve) hours. 04/25/18   Marylene Land, NP  Cholecalciferol (VITAMIN D) 2000 units CAPS Take 1 capsule by mouth daily.    [provider]  fluconazole (DIFLUCAN) 150 MG tablet Take 1 tablet (150 mg  total) by mouth daily. Take one pill orally, then Repeat in 72 hrs as needed. 04/25/18   Marylene Land, NP  ibuprofen (ADVIL,MOTRIN) 800 MG tablet Take 1 tablet (800 mg total) by mouth 3 (three) times daily. 06/15/16   Melynda Ripple, MD  Multiple Vitamin (MULTIVITAMIN) tablet Take 1 tablet by mouth daily.    [provider]    No Known Allergies  Family History  Problem Relation Age of Onset  . Diabetes Mother   . Hypothyroidism Mother   . Alcoholism Father   . Dementia Father   . Cancer Brother 32       lung cancer  . Breast cancer Maternal Aunt 80  . Breast cancer Cousin 42       mat cousin    Social History Social History   Tobacco Use  . Smoking status: Never Smoker  . Smokeless tobacco: Never Used  Substance Use Topics  . Alcohol use: Yes    Alcohol/week: 0.0 oz    Comment: occasionally  . Drug use: No    Review of Systems Constitutional: Negative for fever. Eyes: Negative for visual complaints ENT: Negative for recent illness/congestion Cardiovascular: Negative for chest pain. Respiratory: Negative for shortness of breath. Gastrointestinal: Moderate left lower quadrant abdominal pain.  Positive for nausea and loose stool.  Negative for vomiting. Genitourinary: Negative for dysuria or hematuria Musculoskeletal: Negative for musculoskeletal complaints Skin: Negative for skin complaints  Neurological: Negative for headache All other ROS negative  ____________________________________________   PHYSICAL EXAM:  VITAL SIGNS: ED Triage Vitals  Enc Vitals Group     BP 05/04/18 1801 (!) 145/96     Pulse Rate 05/04/18 1801 90     Resp 05/04/18 1801 18     Temp 05/04/18 1801 98.6 F (37 C)     Temp Source 05/04/18 1801 Oral     SpO2 05/04/18 1801 100 %     Weight --      Height --      Head Circumference --      Peak Flow --      Pain Score 05/04/18 1804 10     Pain Loc --      Pain Edu? --      Excl. in [place]? --     Constitutional: Alert and  oriented. Well appearing and in no distress. Eyes: Normal exam ENT   Head: Normocephalic and atraumatic   Mouth/Throat: Mucous membranes are moist. Cardiovascular: Normal rate, regular rhythm. No murmur Respiratory: Normal respiratory effort without tachypnea nor retractions. Breath sounds are clear Gastrointestinal: Soft, moderate left lower quadrant abdominal tenderness to palpation mild suprapubic tenderness to palpation.  No rebound or guarding.  No distention. Musculoskeletal: Nontender with normal range of motion in all extremities. Neurologic:  Normal speech and language. No gross focal neurologic deficits  Skin:  Skin is warm, dry and intact.  Psychiatric: Mood and affect are normal. Speech and behavior are normal.   ____________________________________________   RADIOLOGY  CT shows acute diverticulitis with a 2.3 x 6 cm fluid collection likely abscess.  ____________________________________________   INITIAL IMPRESSION / ASSESSMENT AND PLAN / ED COURSE  Pertinent labs & imaging results that were available during my care of the patient were reviewed by me and considered in my medical decision making (see chart for details).  Patient presents emergency department for left lower quadrant abdominal pain ongoing for the past 10 days and progressively worsening despite taking Augmentin.  Differential would include urinary tract infection, pyelonephritis, colitis or diverticulitis, mass or tumor, ovarian cyst.  Patient's lab work is reassuring including a normal white blood cell count.  Chemistry is normal including LFTs.  Normal lipase.  Urinalysis is normal.  We will obtain CT imaging to further evaluate given the moderate abdominal tenderness to palpation left lower quadrant highly suspect possibility of diverticulitis or colitis.  We will treat the patient's pain and nausea, IV hydrate while awaiting CT imaging.  CT consistent with complicated diverticulitis with abscess.   Discussed with general surgery will be admitting the patient.  We will start the patient on IV Zosyn and admit.  ____________________________________________   FINAL CLINICAL IMPRESSION(S) / ED DIAGNOSES  Left lower quadrant abdominal pain Complicated diverticulitis   Harvest Dark, MD 05/04/18 2234

## 2018-05-04 NOTE — ED Triage Notes (Addendum)
PT arrived with complaints of lower abdominal pain and states it feels like a tightness and pressure. Pt was evaluated by her primary doctor but told to come to ED for a CT scan if the pain came back. Pt states the pain has been intermittent for 10 days. Pt denies any vomiting but does states she is nauseated.

## 2018-05-04 NOTE — H&P (Signed)
SURGICAL CONSULTATION NOTE   HISTORY OF PRESENT ILLNESS (HPI):  57 y.o. female presented to [facility] ED for evaluation of abdominal pain. Patient reports she started on abdominal pain since April 25 2018 when she went to [facility]gent care and she was diagnosed with UTI. The patient was treated with Augmenting and was told that if the pain does not improve to come to the ER. The pain remain constant but did not got worse until today and she decided to come to the ER. Patient refer that today was intense. Pain localized on the left lower quadrant and radiated to the suprapubic area. Denies diarrhea or rectal bleeding. Refers has been eating regular diet including [place] food yesterday. Denies nausea or vomiting. Ct scan today was reviewed showing significant inflammation of the descending and sigmoid colon with an abscess next to the sigmoid colon. There is significant fat stranding around the diverticular disease consistent with diverticulitis. No free air, no significant free fluid.    Patient refers had a similar episode 12 years ago. Last colonoscopy was 12 years ago after she recovered from an episode of diverticulitis. Due for colonoscopy 2 years ago.   Surgery is consulted by Dr. Kerman Passey in this context for evaluation and management of complicated diverticulitis.  PAST MEDICAL HISTORY (PMH):  Past Medical History:  Diagnosis Date  . Allergy   . Hypertension      PAST SURGICAL HISTORY ([place]):  Past Surgical History:  Procedure Laterality Date  . ganglion cyst removal Right      MEDICATIONS:  Prior to Admission medications   Medication Sig Start Date End Date Taking? Authorizing Provider  amoxicillin-clavulanate (AUGMENTIN) 875-125 MG tablet Take 1 tablet by mouth every 12 (twelve) hours. 04/25/18  Yes Marylene Land, NP  Cholecalciferol (VITAMIN D) 2000 units CAPS Take 1 capsule by mouth daily.   Yes [provider]  Multiple Vitamin (MULTIVITAMIN) tablet Take 1 tablet by  mouth daily.   Yes [provider]  fluconazole (DIFLUCAN) 150 MG tablet Take 1 tablet (150 mg total) by mouth daily. Take one pill orally, then Repeat in 72 hrs as needed. Patient not taking: Reported on 05/04/2018 04/25/18   Marylene Land, NP  ibuprofen (ADVIL,MOTRIN) 800 MG tablet Take 1 tablet (800 mg total) by mouth 3 (three) times daily. Patient not taking: Reported on 05/04/2018 06/15/16   Melynda Ripple, MD     ALLERGIES:  No Known Allergies   SOCIAL HISTORY:  Social History   Socioeconomic History  . Marital status: Single    Spouse name: Not on file  . Number of children: Not on file  . Years of education: Not on file  . Highest education level: Not on file  Occupational History  . Not on file  Social Needs  . Financial resource strain: Not on file  . Food insecurity:    Worry: Not on file    Inability: Not on file  . Transportation needs:    Medical: Not on file    Non-medical: Not on file  Tobacco Use  . Smoking status: Never Smoker  . Smokeless tobacco: Never Used  Substance and Sexual Activity  . Alcohol use: Yes    Alcohol/week: 0.0 oz    Comment: occasionally  . Drug use: No  . Sexual activity: Never  Lifestyle  . Physical activity:    Days per week: Not on file    Minutes per session: Not on file  . Stress: Not on file  Relationships  . Social connections:  Talks on phone: Not on file    Gets together: Not on file    Attends religious service: Not on file    Active member of club or organization: Not on file    Attends meetings of clubs or organizations: Not on file    Relationship status: Not on file  . Intimate partner violence:    Fear of current or ex partner: Not on file    Emotionally abused: Not on file    Physically abused: Not on file    Forced sexual activity: Not on file  Other Topics Concern  . Not on file  Social History Narrative  . Not on file    The patient currently resides (home / rehab facility / nursing  home): Home The patient normally is (ambulatory / bedbound): Ambulatory   FAMILY HISTORY:  Family History  Problem Relation Age of Onset  . Diabetes Mother   . Hypothyroidism Mother   . Alcoholism Father   . Dementia Father   . Cancer Brother 49       lung cancer  . Breast cancer Maternal Aunt 80  . Breast cancer Cousin 68       mat cousin     REVIEW OF SYSTEMS:  Constitutional: denies weight loss, fever, chills, or sweats  Eyes: denies any other vision changes, history of eye injury  ENT: denies sore throat, hearing problems. Positive for nose congestion Respiratory: denies shortness of breath, wheezing. Positive for flu like symptoms.  Cardiovascular: denies chest pain, palpitations  Gastrointestinal: See HPI Genitourinary: denies burning with urination or urinary frequency Musculoskeletal: denies any other joint pains or cramps  Skin: denies any other rashes or skin discolorations  Neurological: denies any other headache, dizziness, weakness  Psychiatric: denies any other depression, anxiety   All other review of systems were negative   VITAL SIGNS:  Temp:  [98.1 F (36.7 C)-98.6 F (37 C)] 98.1 F (36.7 C) (06/21 2057) Pulse Rate:  [85-90] 85 (06/21 2057) Resp:  [18-20] 20 (06/21 2057) BP: (145-153)/(87-96) 153/87 (06/21 2057) SpO2:  [98 %-100 %] 98 % (06/21 2057)             INTAKE/OUTPUT:  This shift: No intake/output data recorded.  Last 2 shifts: @IOLAST2SHIFTS @   PHYSICAL EXAM:  Constitutional:  -- Normal body habitus  -- Awake, alert, and oriented x3  Eyes:  -- Pupils equally round and reactive to light  -- No scleral icterus  Ear, nose, and throat:  -- No jugular venous distension  Pulmonary:  -- No crackles  -- Equal breath sounds bilaterally -- Breathing non-labored at rest Cardiovascular:  -- S1, S2 present  -- No pericardial rubs Gastrointestinal:  -- Abdomen soft, moderate tender, non-distended, positive guarding. No rebound  tenderness -- No abdominal masses appreciated, pulsatile or otherwise  Musculoskeletal and Integumentary:  -- Wounds or skin discoloration: None appreciated -- Extremities: B/L UE and LE FROM, hands and feet warm, no edema  Neurologic:  -- Motor function: intact and symmetric -- Sensation: intact and symmetric   Labs:  CBC Latest Ref Rng & Units 05/04/2018 04/25/2018 12/01/2017  WBC 3.6 - 11.0 K/uL 9.5 11.2(H) 6.4  Hemoglobin 12.0 - 16.0 g/dL 12.5 13.2 12.4  Hematocrit 35.0 - 47.0 % 36.8 38.6 37.3  Platelets 150 - 440 K/uL 399 365 405(H)   CMP Latest Ref Rng & Units 05/04/2018 04/25/2018 12/01/2017  Glucose 65 - 99 mg/dL 116(H) 102(H) 83  BUN 6 - 20 mg/dL 13 17 17   Creatinine  0.44 - 1.00 mg/dL 0.65 0.87 0.86  Sodium 135 - 145 mmol/L 138 136 138  Potassium 3.5 - 5.1 mmol/L 3.7 4.4 4.6  Chloride 101 - 111 mmol/L 105 99(L) 104  CO2 22 - 32 mmol/L 25 25 29   Calcium 8.9 - 10.3 mg/dL 8.9 9.3 9.6  Total Protein 6.5 - 8.1 g/dL 8.0 - 7.3  Total Bilirubin 0.3 - 1.2 mg/dL 0.3 - 0.4  Alkaline Phos 38 - 126 U/L 102 - -  AST 15 - 41 U/L 27 - 14  ALT 14 - 54 U/L 37 - 19    Imaging studies:  EXAM: CT ABDOMEN AND PELVIS WITH CONTRAST  TECHNIQUE: Multidetector CT imaging of the abdomen and pelvis was performed using the standard protocol following bolus administration of intravenous contrast.  CONTRAST:  161mL ISOVUE-300 IOPAMIDOL (ISOVUE-300) INJECTION 61%  COMPARISON:  12/14/2017  FINDINGS: Lower chest: Mild dependent changes in the lung bases.  Hepatobiliary: No focal liver abnormality is seen. No gallstones, gallbladder wall thickening, or biliary dilatation.  Pancreas: Unremarkable. No pancreatic ductal dilatation or surrounding inflammatory changes.  Spleen: Normal in size without focal abnormality.  Adrenals/Urinary Tract: No adrenal gland nodules. 3 mm stone in the midpole right kidney. No change in size or position since previous study. No hydronephrosis or  hydroureter. No ureteral stones. Nephrograms are symmetrical. Bladder wall is not thickened. No bladder filling defects.  Stomach/Bowel: Diverticulosis of the sigmoid colon. There is wall thickening in the sigmoid colon with pericolonic stranding and edema consistent with acute cholecystitis. Disorganized fluid collection inferior to the sigmoid colon measuring 2.3 x 6 cm in diameter. This may represent early abscess formation although no definitive wall has yet formed. Stomach, small bowel, and colon are not abnormally distended. Scattered stool throughout the colon. Mesenteric lymph nodes are likely reactive. Appendix is normal.  Vascular/Lymphatic: No significant vascular findings are present. No enlarged abdominal or pelvic lymph nodes.  Reproductive: Uterus and bilateral adnexa are unremarkable.  Other: No free air in the abdomen. Abdominal wall musculature appears intact.  Musculoskeletal: No acute or significant osseous findings.  IMPRESSION: 1. Changes of acute diverticulitis involving the sigmoid colon. Disorganized 2.3 x 6 cm fluid collection inferior to the sigmoid colon likely represents early abscess formation. No free air. 2. 3 mm nonobstructing stone in the right kidney.  Electronically Signed   By: Lucienne Capers M.D.   On: 05/04/2018 21:57  Assessment/Plan:  57 y.o. female with PMHx of Hypertension, depression and obesity with clinical and imaging consistent with complicated diverticulitis. She has a pericolonic abscess of 6 x 3 cm. At this moment there is no acute abdomen, no free air on CT scan or free fluid that warrants emergent surgical management. Will admit patient for IV antibiotic therapy and possible percutaneous drainage during this admission. Patient was oriented about the diagnosis of complicated diverticulitis with abscess and the plan of care. Oriented that will try to avoid surgery because if surgery is performed will need the resection of  the diseased colon and a colostomy. Patient understood and agreed with plan. Patient also oriented that after she recovers from this episode the goal will be to do a colonoscopy and perform a sigmoid colectomy as an elective basis.   All of the above findings and recommendations were discussed with the patient and her family, and all of patient's and her family's questions were answered to their expressed satisfaction.  Arnold Long, MD

## 2018-05-05 ENCOUNTER — Other Ambulatory Visit: Payer: Self-pay

## 2018-05-05 DIAGNOSIS — K5720 Diverticulitis of large intestine with perforation and abscess without bleeding: Secondary | ICD-10-CM

## 2018-05-05 MED ORDER — HEPARIN SODIUM (PORCINE) 5000 UNIT/ML IJ SOLN
5000.0000 [IU] | Freq: Three times a day (TID) | INTRAMUSCULAR | Status: DC
  Administered 2018-05-05 – 2018-05-07 (×7): 5000 [IU] via SUBCUTANEOUS
  Filled 2018-05-05 (×7): qty 1

## 2018-05-05 NOTE — Progress Notes (Addendum)
CC: Diverticular abscess Subjective: Feeling better.  Some abdominal pressure and mild pain.  Taking some liquids.  No emesis. CT scan personally reviewed there is evidence of acute diverticulitis with diverticular abscess.  Objective: Vital signs in last 24 hours: Temp:  [98.1 F (36.7 C)-99 F (37.2 C)] 98.2 F (36.8 C) (06/22 6073) Pulse Rate:  [68-98] 68 (06/22 0608) Resp:  [18-20] 20 (06/22 7106) BP: (141-184)/(71-96) 141/71 (06/22 0608) SpO2:  [96 %-100 %] 100 % (06/22 2694) Weight:  [80.2 kg (176 lb 12.9 oz)] 80.2 kg (176 lb 12.9 oz) (06/22 0006) Last BM Date: 05/04/18  Intake/Output from previous day: 06/21 0701 - 06/22 0700 In: -  Out: 350 [Urine:250; Emesis/NG output:100] Intake/Output this shift: Total I/O In: 0  Out: 700 [Urine:700]  Physical exam: NAD awake and alert Abd: soft, mild TTP LLQ< no peritonitis Ext: no edema and well perfused   Lab Results: CBC  Recent Labs    05/04/18 1805  WBC 9.5  HGB 12.5  HCT 36.8  PLT 399   BMET Recent Labs    05/04/18 1805  NA 138  K 3.7  CL 105  CO2 25  GLUCOSE 116*  BUN 13  CREATININE 0.65  CALCIUM 8.9   PT/INR No results for input(s): LABPROT, INR in the last 72 hours. ABG No results for input(s): PHART, HCO3 in the last 72 hours.  Invalid input(s): PCO2, PO2  Studies/Results: Ct Abdomen Pelvis W Contrast  Result Date: 05/04/2018 CLINICAL DATA:  Lower abdominal pain. Tightness and pressure. Pain is intermittent for 10 days. Nausea. EXAM: CT ABDOMEN AND PELVIS WITH CONTRAST TECHNIQUE: Multidetector CT imaging of the abdomen and pelvis was performed using the standard protocol following bolus administration of intravenous contrast. CONTRAST:  175mL ISOVUE-300 IOPAMIDOL (ISOVUE-300) INJECTION 61% COMPARISON:  12/14/2017 FINDINGS: Lower chest: Mild dependent changes in the lung bases. Hepatobiliary: No focal liver abnormality is seen. No gallstones, gallbladder wall thickening, or biliary dilatation.  Pancreas: Unremarkable. No pancreatic ductal dilatation or surrounding inflammatory changes. Spleen: Normal in size without focal abnormality. Adrenals/Urinary Tract: No adrenal gland nodules. 3 mm stone in the midpole right kidney. No change in size or position since previous study. No hydronephrosis or hydroureter. No ureteral stones. Nephrograms are symmetrical. Bladder wall is not thickened. No bladder filling defects. Stomach/Bowel: Diverticulosis of the sigmoid colon. There is wall thickening in the sigmoid colon with pericolonic stranding and edema consistent with acute cholecystitis. Disorganized fluid collection inferior to the sigmoid colon measuring 2.3 x 6 cm in diameter. This may represent early abscess formation although no definitive wall has yet formed. Stomach, small bowel, and colon are not abnormally distended. Scattered stool throughout the colon. Mesenteric lymph nodes are likely reactive. Appendix is normal. Vascular/Lymphatic: No significant vascular findings are present. No enlarged abdominal or pelvic lymph nodes. Reproductive: Uterus and bilateral adnexa are unremarkable. Other: No free air in the abdomen. Abdominal wall musculature appears intact. Musculoskeletal: No acute or significant osseous findings. IMPRESSION: 1. Changes of acute diverticulitis involving the sigmoid colon. Disorganized 2.3 x 6 cm fluid collection inferior to the sigmoid colon likely represents early abscess formation. No free air. 2. 3 mm nonobstructing stone in the right kidney. Electronically Signed   By: Lucienne Capers M.D.   On: 05/04/2018 21:57    Anti-infectives: Anti-infectives (From admission, onward)   Start     Dose/Rate Route Frequency Ordered Stop   05/05/18 0600  piperacillin-tazobactam (ZOSYN) IVPB 3.375 g     3.375 g 12.5 mL/hr over 240  Minutes Intravenous Every 8 hours 05/04/18 2317     05/04/18 2245  piperacillin-tazobactam (ZOSYN) IVPB 3.375 g     3.375 g 100 mL/hr over 30 Minutes  Intravenous  Once 05/04/18 2234 05/05/18 0029      Assessment/Plan:  Diverticular abscess in need for drain placement.  Discussed with the patient detail about disease process.  I do recommend drainage by IR.  We will place the order. No Need for emergent surgical intervention.  I had a lengthy discussion with patient regarding the need for sigmoid colectomy as an outpatient after she completes a colonoscopy.  She is in agreement. We continue to keep her on antibiotics for now. I Spent more than 35 minutes in this encounter with the majority of time spent in coordination and counseling of her care  Caroleen Hamman, MD, [facility]  05/05/2018

## 2018-05-05 NOTE — Progress Notes (Signed)
Chaplain educated patient on AD and answered the patient's questions. Chaplain encouraged the patient to read over the document and to notify Pastoral Care if she has additional questions. Patient plans to complete the AD on Monday/Tuesday.In conclusion, Chaplain provided prayer and emotional support.

## 2018-05-06 LAB — BASIC METABOLIC PANEL
ANION GAP: 8 (ref 5–15)
BUN: 6 mg/dL (ref 6–20)
CHLORIDE: 108 mmol/L (ref 101–111)
CO2: 25 mmol/L (ref 22–32)
CREATININE: 0.61 mg/dL (ref 0.44–1.00)
Calcium: 9 mg/dL (ref 8.9–10.3)
GFR calc Af Amer: >60 mL/min (ref >60)
GFR calc non Af Amer: >60 mL/min (ref >60)
Glucose, Bld: 88 mg/dL (ref 65–99)
POTASSIUM: 3.5 mmol/L (ref 3.5–5.1)
Sodium: 141 mmol/L (ref 135–145)

## 2018-05-06 LAB — CBC
HEMATOCRIT: 34.6 % — AB (ref 35.0–47.0)
HEMOGLOBIN: 12 g/dL (ref 12.0–16.0)
MCH: 30 pg (ref 26.0–34.0)
MCHC: 34.6 g/dL (ref 32.0–36.0)
MCV: 86.7 fL (ref 80.0–100.0)
Platelets: 354 10*3/uL (ref 150–440)
RBC: 3.98 MIL/uL (ref 3.80–5.20)
RDW: 13 % (ref 11.5–14.5)
WBC: 7 10*3/uL (ref 3.6–11.0)

## 2018-05-06 NOTE — Progress Notes (Signed)
CC: Diverticular abscess Subjective: Gust with detail with interventional radiologist he thinks that it is difficult axis collection and is not quite ready to be drained. Patient is feeling little bit better.  Still some pressure.  Tolerating clear liquid diet.  White count is normal  Objective: Vital signs in last 24 hours: Temp:  [98.4 F (36.9 C)-98.6 F (37 C)] 98.6 F (37 C) (06/23 0543) Pulse Rate:  [70-78] 75 (06/23 0543) Resp:  [16] 16 (06/23 0543) BP: (145-153)/(75-91) 150/90 (06/23 0543) SpO2:  [99 %-100 %] 99 % (06/23 0543) Last BM Date: 05/04/18  Intake/Output from previous day: 06/22 0701 - 06/23 0700 In: 2565.8 [P.O.:360; I.V.:2155.8; IV Piggyback:50] Out: 2400 [Urine:2400] Intake/Output this shift: Total I/O In: 420 [P.O.:420] Out: 950 [Urine:950]  Physical exam: NAD, awake and alert Abd: soft, mild TTP LLQ, no peritonitis Ext: well perfused and no edema   Lab Results: CBC  Recent Labs    05/04/18 1805 05/06/18 0457  WBC 9.5 7.0  HGB 12.5 12.0  HCT 36.8 34.6*  PLT 399 354   BMET Recent Labs    05/04/18 1805 05/06/18 0457  NA 138 141  K 3.7 3.5  CL 105 108  CO2 25 25  GLUCOSE 116* 88  BUN 13 6  CREATININE 0.65 0.61  CALCIUM 8.9 9.0   PT/INR No results for input(s): LABPROT, INR in the last 72 hours. ABG No results for input(s): PHART, HCO3 in the last 72 hours.  Invalid input(s): PCO2, PO2  Studies/Results: Ct Abdomen Pelvis W Contrast  Result Date: 05/04/2018 CLINICAL DATA:  Lower abdominal pain. Tightness and pressure. Pain is intermittent for 10 days. Nausea. EXAM: CT ABDOMEN AND PELVIS WITH CONTRAST TECHNIQUE: Multidetector CT imaging of the abdomen and pelvis was performed using the standard protocol following bolus administration of intravenous contrast. CONTRAST:  161mL ISOVUE-300 IOPAMIDOL (ISOVUE-300) INJECTION 61% COMPARISON:  12/14/2017 FINDINGS: Lower chest: Mild dependent changes in the lung bases. Hepatobiliary: No focal  liver abnormality is seen. No gallstones, gallbladder wall thickening, or biliary dilatation. Pancreas: Unremarkable. No pancreatic ductal dilatation or surrounding inflammatory changes. Spleen: Normal in size without focal abnormality. Adrenals/Urinary Tract: No adrenal gland nodules. 3 mm stone in the midpole right kidney. No change in size or position since previous study. No hydronephrosis or hydroureter. No ureteral stones. Nephrograms are symmetrical. Bladder wall is not thickened. No bladder filling defects. Stomach/Bowel: Diverticulosis of the sigmoid colon. There is wall thickening in the sigmoid colon with pericolonic stranding and edema consistent with acute cholecystitis. Disorganized fluid collection inferior to the sigmoid colon measuring 2.3 x 6 cm in diameter. This may represent early abscess formation although no definitive wall has yet formed. Stomach, small bowel, and colon are not abnormally distended. Scattered stool throughout the colon. Mesenteric lymph nodes are likely reactive. Appendix is normal. Vascular/Lymphatic: No significant vascular findings are present. No enlarged abdominal or pelvic lymph nodes. Reproductive: Uterus and bilateral adnexa are unremarkable. Other: No free air in the abdomen. Abdominal wall musculature appears intact. Musculoskeletal: No acute or significant osseous findings. IMPRESSION: 1. Changes of acute diverticulitis involving the sigmoid colon. Disorganized 2.3 x 6 cm fluid collection inferior to the sigmoid colon likely represents early abscess formation. No free air. 2. 3 mm nonobstructing stone in the right kidney. Electronically Signed   By: Lucienne Capers M.D.   On: 05/04/2018 21:57    Anti-infectives: Anti-infectives (From admission, onward)   Start     Dose/Rate Route Frequency Ordered Stop   05/05/18 0600  piperacillin-tazobactam (  ZOSYN) IVPB 3.375 g     3.375 g 12.5 mL/hr over 240 Minutes Intravenous Every 8 hours 05/04/18 2317     05/04/18  2245  piperacillin-tazobactam (ZOSYN) IVPB 3.375 g     3.375 g 100 mL/hr over 30 Minutes Intravenous  Once 05/04/18 2234 05/05/18 0029      Assessment/Plan:  Ventricular abscess.  Currently not amenable for percutaneous drainage.  We will manage her clinically.  If her symptoms worsens we will repeat a CT scan.  For now we will continue antibiotic therapy.  Patient is hungry and wishes to stay on a clear liquid diet.  No need for emergent surgical intervention at this time  Caroleen Hamman, MD, [facility]  05/06/2018

## 2018-05-07 ENCOUNTER — Encounter: Payer: Self-pay | Admitting: Family Medicine

## 2018-05-07 NOTE — Progress Notes (Signed)
Chaplain received an earlier PG from the unit regarding the patient, who had asked for a Bible. Chaplain located a Margaret Kramer for the patient. Upon entering the room, chaplain introduced herself and met the patient, her mother, and sister. Patient explained that she gave her purse to another sister, which had her Bible and other necessities. She let her nurse know her needs and was glad to have them met in that moment. Patient said she is feeling much better, that the antibiotics seem to be working well, and that she is being well-cared for by medical staff. Patient said that she had no other needs at that time, but would PG if necessary.     05/07/18 1100  Clinical Encounter Type  Visited With Patient and family together  Visit Type Spiritual support  Spiritual Encounters  Spiritual Needs [facility] text

## 2018-05-07 NOTE — Progress Notes (Signed)
SURGICAL PROGRESS NOTE (cpt [phone])  Hospital Day(s): 3.   Post op day(s):  Marland Kitchen   Interval History: Patient seen and examined, no acute events or new complaints overnight. Patient reports her pain continues to improve with +flatus, +BM, and tolerating clear liquids diet and moderate ambulation. Patient otherwise denies fever/chills, N/V, CP, or SOB.  Review of Systems:  Constitutional: denies fever, chills  HEENT: denies cough or congestion  Respiratory: denies any shortness of breath  Cardiovascular: denies chest pain or palpitations  Gastrointestinal: abdominal pain, N/V, and bowel function as per interval history Genitourinary: denies burning with urination or urinary frequency Musculoskeletal: denies pain, decreased motor or sensation Integumentary: denies any other rashes or skin discolorations Neurological: denies HA or vision/hearing changes   Vital signs in last 24 hours: [min-max] current  Temp:  [98.2 F (36.8 C)-98.6 F (37 C)] 98.5 F (36.9 C) (06/24 0544) Pulse Rate:  [67-82] 75 (06/24 0544) Resp:  [16-18] 16 (06/24 0544) BP: (114-157)/(77-98) 114/77 (06/24 0544) SpO2:  [98 %-100 %] 98 % (06/24 0544)     Height: 5\' 2"  (157.5 cm) Weight: 176 lb 12.9 oz (80.2 kg) BMI (Calculated): 32.33   Intake/Output this shift:  No intake/output data recorded.   Intake/Output last 2 shifts:  @IOLAST2SHIFTS @   Physical Exam:  Constitutional: alert, cooperative and no distress  HENT: normocephalic without obvious abnormality  Eyes: PERRL, EOM's grossly intact and symmetric  Neuro: CN II - XII grossly intact and symmetric without deficit  Respiratory: breathing non-labored at rest  Cardiovascular: regular rate and sinus rhythm  Gastrointestinal: soft and non-distended with mild LLQ > suprapubic abdominal tenderness to palpation Musculoskeletal: UE and LE FROM, no open wounds, motor and sensation grossly intact, NT   Labs:  CBC Latest Ref Rng & Units 05/06/2018 05/04/2018  04/25/2018  WBC 3.6 - 11.0 K/uL 7.0 9.5 11.2(H)  Hemoglobin 12.0 - 16.0 g/dL 12.0 12.5 13.2  Hematocrit 35.0 - 47.0 % 34.6(L) 36.8 38.6  Platelets 150 - 440 K/uL 354 399 365   CMP Latest Ref Rng & Units 05/06/2018 05/04/2018 04/25/2018  Glucose 65 - 99 mg/dL 88 116(H) 102(H)  BUN 6 - 20 mg/dL 6 13 17   Creatinine 0.44 - 1.00 mg/dL 0.61 0.65 0.87  Sodium 135 - 145 mmol/L 141 138 136  Potassium 3.5 - 5.1 mmol/L 3.5 3.7 4.4  Chloride 101 - 111 mmol/L 108 105 99(L)  CO2 22 - 32 mmol/L 25 25 25   Calcium 8.9 - 10.3 mg/dL 9.0 8.9 9.3  Total Protein 6.5 - 8.1 g/dL - 8.0 -  Total Bilirubin 0.3 - 1.2 mg/dL - 0.3 -  Alkaline Phos 38 - 126 U/L - 102 -  AST 15 - 41 U/L - 27 -  ALT 14 - 54 U/L - 37 -   Imaging studies: No new pertinent imaging studies  Assessment/Plan: (ICD-10's: K22.20) 57 y.o. female with recurrent sigmoid colonic diverticulitis, 12 years s/p prior episode, with 6 cm x 3 cm developing peri-colonic abscess, complicated by resolved leukocytosis and by pertinent comorbidities including obesity (BMI > 32) and HTN.               - IV antibiotics (Zosyn ongoing)             - pain control prn, minimize narcotics              - will advance to full liquids diet, decrease IV fluids  - anticipate soft diet tomorrow with CT prior to discharge likely 6/26 +/-  IR drainage             - when tolerating PO, will need to maintain hydration + initially low fiber x 6 weeks, then high fiber diet  - patient overdue for and requires outpatient colonoscopy >6 weeks following resolution of inflammation             - possibility of surgery with partial colectomy and likely colostomy discussed if doesn't improve/resolve with antibiotics             - possibility of elective outpatient elective sigmoid colectomy also discussed             - DVT prophylaxis, ambulation encouraged  All of the above findings and recommendations were discussed with the patient, and all of patient's questions were answered to  her expressed satisfaction.  -- Margaret Kramer Rosana Hoes, MD, [place]: [facility] - Partnering for exceptional care. Office: [phone]

## 2018-05-08 LAB — HIV ANTIBODY (ROUTINE TESTING W REFLEX): HIV Screen 4th Generation wRfx: NONREACTIVE

## 2018-05-08 MED ORDER — ENOXAPARIN SODIUM 40 MG/0.4ML ~~LOC~~ SOLN
40.0000 mg | SUBCUTANEOUS | Status: DC
  Administered 2018-05-08 – 2018-05-09 (×2): 40 mg via SUBCUTANEOUS
  Filled 2018-05-08 (×2): qty 0.4

## 2018-05-08 NOTE — Progress Notes (Signed)
SURGICAL PROGRESS NOTE (cpt [phone])  Hospital Day(s): 4.   Post op day(s):  Marland Kitchen   Interval History: Patient seen and examined, no acute events or new complaints overnight. Patient reports she continues to feel better with further improved LLQ abdominal pain and tolerating full liquids diet, denies fever/chills, N/V, CP, or SOB and has been ambulating.  Review of Systems:  Constitutional: denies fever, chills  HEENT: denies cough or congestion  Respiratory: denies any shortness of breath  Cardiovascular: denies chest pain or palpitations  Gastrointestinal: abdominal pain, N/V, and bowel function as per interval history Genitourinary: denies burning with urination or urinary frequency Musculoskeletal: denies pain, decreased motor or sensation Integumentary: denies any other rashes or skin discolorations Neurological: denies HA or vision/hearing changes   Vital signs in last 24 hours: [min-max] current  Temp:  [97.6 F (36.4 C)-98.4 F (36.9 C)] 98.4 F (36.9 C) (06/25 0407) Pulse Rate:  [73-82] 82 (06/25 0407) Resp:  [18-20] 18 (06/25 0407) BP: (108-150)/(71-94) 108/71 (06/25 0407) SpO2:  [97 %-100 %] 100 % (06/25 0407)     Height: 5\' 2"  (157.5 cm) Weight: 176 lb 12.9 oz (80.2 kg) BMI (Calculated): 32.33   Intake/Output this shift:  No intake/output data recorded.   Intake/Output last 2 shifts:  @IOLAST2SHIFTS @   Physical Exam:  Constitutional: alert, cooperative and no distress  HENT: normocephalic without obvious abnormality  Eyes: PERRL, EOM's grossly intact and symmetric  Neuro: CN II - XII grossly intact and symmetric without deficit  Respiratory: breathing non-labored at rest  Cardiovascular: regular rate and sinus rhythm  Gastrointestinal: soft and non-distended with mild LLQ abdominal tenderness to palpation, no guarding or rebound tenderness Musculoskeletal: UE and LE FROM, no edema or wounds, motor and sensation grossly intact, NT   Labs:  CBC Latest Ref Rng &  Units 05/06/2018 05/04/2018 04/25/2018  WBC 3.6 - 11.0 K/uL 7.0 9.5 11.2(H)  Hemoglobin 12.0 - 16.0 g/dL 12.0 12.5 13.2  Hematocrit 35.0 - 47.0 % 34.6(L) 36.8 38.6  Platelets 150 - 440 K/uL 354 399 365   CMP Latest Ref Rng & Units 05/06/2018 05/04/2018 04/25/2018  Glucose 65 - 99 mg/dL 88 116(H) 102(H)  BUN 6 - 20 mg/dL 6 13 17   Creatinine 0.44 - 1.00 mg/dL 0.61 0.65 0.87  Sodium 135 - 145 mmol/L 141 138 136  Potassium 3.5 - 5.1 mmol/L 3.5 3.7 4.4  Chloride 101 - 111 mmol/L 108 105 99(L)  CO2 22 - 32 mmol/L 25 25 25   Calcium 8.9 - 10.3 mg/dL 9.0 8.9 9.3  Total Protein 6.5 - 8.1 g/dL - 8.0 -  Total Bilirubin 0.3 - 1.2 mg/dL - 0.3 -  Alkaline Phos 38 - 126 U/L - 102 -  AST 15 - 41 U/L - 27 -  ALT 14 - 54 U/L - 37 -   Imaging studies: No new pertinent imaging studies   Assessment/Plan: (ICD-10's: K19.20) 58 y.o. female with recurrent sigmoid colonic diverticulitis, 12 years s/p prior episode, with 6 cm x 3 cm developing peri-colonic abscess, complicated by resolved leukocytosis and by pertinent comorbidities including obesity (BMI > 32) and HTN.   - IV antibiotics (Zosyn ongoing) - pain control prn, minimize narcotics  - will advance to soft diet, decrease IVfluids             - will check CT abdomen/pelvic prior to discharge tentatively tomorrow +/- IR drainage - when tolerating PO, will need tomaintain hydration + initially low fiber x 6 weeks, then high fiber diet             -  patient overdue for and requires outpatient colonoscopy >6 weeks following resolution of inflammation - possibility of surgery with partial colectomy and likely colostomy discussed if doesn't improve/resolve with antibiotics - possibility of elective outpatient elective sigmoid colectomy also discussed - DVT prophylaxis, ambulation encouraged  All of the above findings and recommendations were discussed with the patient, and  all of patient's questions were answered to her expressed satisfaction.  -- Marilynne Drivers Rosana Hoes, MD, [place]: [facility] - Partnering for exceptional care. Office: [phone]

## 2018-05-09 ENCOUNTER — Inpatient Hospital Stay: Payer: BLUE CROSS/BLUE SHIELD

## 2018-05-09 MED ORDER — SODIUM CHLORIDE 0.9 % IV SOLN: INTRAVENOUS | Status: DC

## 2018-05-09 MED ORDER — IOPAMIDOL (ISOVUE-300) INJECTION 61%
100.0000 mL | Freq: Once | INTRAVENOUS | Status: AC | PRN
  Administered 2018-05-09: 100 mL via INTRAVENOUS

## 2018-05-10 ENCOUNTER — Inpatient Hospital Stay: Payer: BLUE CROSS/BLUE SHIELD

## 2018-05-10 LAB — PROTIME-INR
INR: 1.04
Prothrombin Time: 13.5 seconds (ref 11.4–15.2)

## 2018-05-10 LAB — APTT: APTT: 31 s (ref 24–36)

## 2018-05-10 MED ORDER — MIDAZOLAM HCL 5 MG/5ML IJ SOLN
INTRAMUSCULAR | Status: AC
  Administered 2018-05-10: 13:00:00
  Filled 2018-05-10: qty 5

## 2018-05-10 MED ORDER — MIDAZOLAM HCL 2 MG/2ML IJ SOLN
INTRAMUSCULAR | Status: AC | PRN
  Administered 2018-05-10 (×2): 1 mg via INTRAVENOUS

## 2018-05-10 MED ORDER — FENTANYL CITRATE (PF) 100 MCG/2ML IJ SOLN
INTRAMUSCULAR | Status: AC | PRN
  Administered 2018-05-10 (×3): 50 ug via INTRAVENOUS

## 2018-05-10 MED ORDER — FENTANYL CITRATE (PF) 100 MCG/2ML IJ SOLN
INTRAMUSCULAR | Status: AC
  Administered 2018-05-10: 13:00:00
  Filled 2018-05-10: qty 4

## 2018-05-10 MED ORDER — AMOXICILLIN-POT CLAVULANATE 875-125 MG PO TABS: 1.0000 | ORAL_TABLET | Freq: Two times a day (BID) | ORAL | 0 refills | Status: AC

## 2018-05-10 NOTE — Progress Notes (Signed)
05/10/2018 4:34 PM  Margaret Kramer to be D/C'd Home per MD order.  Discussed prescriptions and follow up appointments with the patient. Prescriptions given to patient, medication list explained in detail. Pt verbalized understanding.  Allergies as of 05/10/2018   No Known Allergies     Medication List    TAKE these medications   amoxicillin-clavulanate 875-125 MG tablet Commonly known as:  AUGMENTIN Take 1 tablet by mouth 2 (two) times daily for 10 days. What changed:  when to take this   fluconazole 150 MG tablet Commonly known as:  DIFLUCAN Take 1 tablet (150 mg total) by mouth daily. Take one pill orally, then Repeat in 72 hrs as needed.   ibuprofen 800 MG tablet Commonly known as:  ADVIL,MOTRIN Take 1 tablet (800 mg total) by mouth 3 (three) times daily.   multivitamin tablet Take 1 tablet by mouth daily.   Vitamin D 2000 units Caps Take 1 capsule by mouth daily.       Vitals:   05/10/18 1245 05/10/18 1253  BP: 132/69   Pulse: 65 62  Resp: 13 11  Temp:    SpO2: 96% 99%    Skin clean, dry and intact without evidence of skin break down, no evidence of skin tears noted. IV catheter discontinued intact. Site without signs and symptoms of complications. Dressing and pressure applied. Pt denies pain at this time. No complaints noted.  An After Visit Summary was printed and given to the patient. Patient escorted via [place], and D/C home via private auto.  Dola Argyle

## 2018-05-10 NOTE — Progress Notes (Signed)
Patient post procedure (abscess drainage) per Dr Anselm Pancoast, no drain tube required at this time. Denies complaints at this time, received Versed 2mg  iv as well as 123mcg Fentanyl for procedure. Report called to care nurse, ie Loralyn Freshwater with questions answered.

## 2018-05-10 NOTE — Progress Notes (Signed)
SURGICAL PROGRESS NOTE (cpt [phone])  Hospital Day(s): 6.   Post op day(s):  Margaret Kramer   Interval History: Patient seen and examined, no acute events or new complaints overnight. Patient reports her LLQ > suprapubic abdominal pain has continued to improve with passage of a small BM as well, tolerating soft diet without N/V, fever/chills, CP, or SOB. She has also been ambulating, and repeat CT abdomen and pelvis planned for today to reassess pelvic peri-colonic abscess for possible drainage was just completed.  Review of Systems:  Constitutional: denies fever, chills  HEENT: denies cough or congestion  Respiratory: denies any shortness of breath  Cardiovascular: denies chest pain or palpitations  Gastrointestinal: abdominal pain, N/V, and bowel function as per interval history Genitourinary: denies burning with urination or urinary frequency Musculoskeletal: denies pain, decreased motor or sensation Integumentary: denies any other rashes or skin discolorations Neurological: denies HA or vision/hearing changes   Vital signs in last 24 hours: [min-max] current  Temp:  [98 F (36.7 C)-98.3 F (36.8 C)] 98.1 F (36.7 C) (06/27 0513) Pulse Rate:  [70-79] 78 (06/27 0513) Resp:  [16-18] 18 (06/27 0513) BP: (119-142)/(83-95) 119/83 (06/27 0513) SpO2:  [100 %] 100 % (06/27 0513)     Height: 5\' 2"  (157.5 cm) Weight: 176 lb 12.9 oz (80.2 kg) BMI (Calculated): 32.33   Intake/Output this shift:  No intake/output data recorded.   Intake/Output last 2 shifts:  @IOLAST2SHIFTS @   Physical Exam:  Constitutional: alert, cooperative and no distress  HENT: normocephalic without obvious abnormality  Eyes: PERRL, EOM's grossly intact and symmetric  Neuro: CN II - XII grossly intact and symmetric without deficit  Respiratory: breathing non-labored at rest  Cardiovascular: regular rate and sinus rhythm  Gastrointestinal: soft and non-distended with mild LLQ > suprapubic abdominal tenderness to  palpation Musculoskeletal: UE and LE FROM, no edema or wounds, motor and sensation grossly intact, NT   Labs:  CBC Latest Ref Rng & Units 05/06/2018 05/04/2018 04/25/2018  WBC 3.6 - 11.0 K/uL 7.0 9.5 11.2(H)  Hemoglobin 12.0 - 16.0 g/dL 12.0 12.5 13.2  Hematocrit 35.0 - 47.0 % 34.6(L) 36.8 38.6  Platelets 150 - 440 K/uL 354 399 365   CMP Latest Ref Rng & Units 05/06/2018 05/04/2018 04/25/2018  Glucose 65 - 99 mg/dL 88 116(H) 102(H)  BUN 6 - 20 mg/dL 6 13 17   Creatinine 0.44 - 1.00 mg/dL 0.61 0.65 0.87  Sodium 135 - 145 mmol/L 141 138 136  Potassium 3.5 - 5.1 mmol/L 3.5 3.7 4.4  Chloride 101 - 111 mmol/L 108 105 99(L)  CO2 22 - 32 mmol/L 25 25 25   Calcium 8.9 - 10.3 mg/dL 9.0 8.9 9.3  Total Protein 6.5 - 8.1 g/dL - 8.0 -  Total Bilirubin 0.3 - 1.2 mg/dL - 0.3 -  Alkaline Phos 38 - 126 U/L - 102 -  AST 15 - 41 U/L - 27 -  ALT 14 - 54 U/L - 37 -   Imaging studies:  CT Abdomen and Pelvis with Contrast (05/09/2018) - personally reviewed and discussed both with interventional radiologist and patient 1. Slight increase and fluid collection adjacent to sigmoid diverticulitis. The collection measures 5.6 x 3.5 x 1.3 cm. This likely represents a developing abscess. 2. Extent of inflamed bowel is not significantly changed. 3. Additional diverticular changes in the descending colon without other inflammatory changes. 4. Stable 3 mm nonobstructing stone in the right kidney.   Assessment/Plan: (ICD-10's: K57.20) 57 y.o.femalewith recurrent sigmoid colonic diverticulitis, 12 years s/p prior episode,with 6  cm x 3cm developingperi-colonic abscess, complicated byresolved leukocytosis and bypertinent comorbidities includingobesity (BMI > 32) and HTN.   - IV antibiotics (Zosynongoing) - pain control prn, minimize narcotics  -was NPO for possible IR abscess drainage, but will resume diet and NPO after midnight - IR agrees there now appears to  be a window to permit drainage of pelvic peri-colonic abscess, to be done tomorrow morning  - discussed with patient discharge home either following abscess drainage or following morning dependent on how she feels - patient overdue for and requires outpatient colonoscopy>6 weeks following resolution of inflammation - when tolerating PO, will need tomaintain hydration + initially low fiber x 6 weeks, then high fiber diet -possibility ofelective outpatient elective sigmoid colectomyalsodiscussed - DVT prophylaxis, ambulation encouraged  All of the above findings and recommendations were discussed with the patient, and all of patient's questions were answered to her expressed satisfaction.  -- Marilynne Drivers Rosana Hoes, MD, [place]: [facility] - Partnering for exceptional care. Office: [phone]

## 2018-05-10 NOTE — H&P (Signed)
Chief Complaint: Patient was seen in consultation today for  Chief Complaint  Patient presents with  . Abdominal Pain     Referring Physician(s): Tama High, MD   Patient Status: [facility] - In-pt  History of Present Illness: Margaret Kramer is a 57 y.o. female with recurrent diverticulitis.  Presented with left lower abdominal pain.  Follow-up CT demonstrates a developing fluid collection in left lower quadrant and suspicious for developing abscess.  Patient is feeling better since being admitted to hospital.  Only new complaint is a cough. Still has mild abdominal pain.   Past Medical History:  Diagnosis Date  . Allergy   . Hypertension     Past Surgical History:  Procedure Laterality Date  . ganglion cyst removal Right     Allergies: Patient has no known allergies.  Medications: Prior to Admission medications   Medication Sig Start Date End Date Taking? Authorizing Provider  amoxicillin-clavulanate (AUGMENTIN) 875-125 MG tablet Take 1 tablet by mouth every 12 (twelve) hours. 04/25/18  Yes Marylene Land, NP  Cholecalciferol (VITAMIN D) 2000 units CAPS Take 1 capsule by mouth daily.   Yes [provider]  Multiple Vitamin (MULTIVITAMIN) tablet Take 1 tablet by mouth daily.   Yes [provider]  fluconazole (DIFLUCAN) 150 MG tablet Take 1 tablet (150 mg total) by mouth daily. Take one pill orally, then Repeat in 72 hrs as needed. Patient not taking: Reported on 05/04/2018 04/25/18   Marylene Land, NP  ibuprofen (ADVIL,MOTRIN) 800 MG tablet Take 1 tablet (800 mg total) by mouth 3 (three) times daily. Patient not taking: Reported on 05/04/2018 06/15/16   Melynda Ripple, MD     Family History  Problem Relation Age of Onset  . Diabetes Mother   . Hypothyroidism Mother   . Alcoholism Father   . Dementia Father   . Cancer Brother 71       lung cancer  . Breast cancer Maternal Aunt 80  . Breast cancer Cousin 70       mat cousin    Social  History   Socioeconomic History  . Marital status: Single    Spouse name: Not on file  . Number of children: Not on file  . Years of education: Not on file  . Highest education level: Not on file  Occupational History  . Not on file  Social Needs  . Financial resource strain: Not on file  . Food insecurity:    Worry: Not on file    Inability: Not on file  . Transportation needs:    Medical: Not on file    Non-medical: Not on file  Tobacco Use  . Smoking status: Never Smoker  . Smokeless tobacco: Never Used  Substance and Sexual Activity  . Alcohol use: Yes    Alcohol/week: 0.0 oz    Comment: occasionally  . Drug use: No  . Sexual activity: Never  Lifestyle  . Physical activity:    Days per week: Not on file    Minutes per session: Not on file  . Stress: Not on file  Relationships  . Social connections:    Talks on phone: Not on file    Gets together: Not on file    Attends religious service: Not on file    Active member of club or organization: Not on file    Attends meetings of clubs or organizations: Not on file    Relationship status: Not on file  Other Topics Concern  . Not on file  Social History Narrative  . Not on file     Review of Systems  Respiratory: Positive for cough.   Gastrointestinal: Positive for abdominal pain.    Vital Signs: BP 119/83 (BP Location: Right Arm)   Pulse 78   Temp 98.1 F (36.7 C) (Oral)   Resp 18   Ht 5\' 2"  (1.575 m)   Wt 176 lb 12.9 oz (80.2 kg)   SpO2 100%   BMI 32.34 kg/m   Physical Exam  Constitutional: No distress.  HENT:  Mouth/Throat: Oropharynx is clear and moist.  Pulmonary/Chest: Effort normal and breath sounds normal.  Abdominal: She exhibits no distension. There is tenderness.  Mild tenderness in left lower quadrant.    Imaging: Ct Abdomen Pelvis W Contrast  Result Date: 05/09/2018 CLINICAL DATA:  Abdominal pain, fever, abscess suspected. EXAM: CT ABDOMEN AND PELVIS WITH CONTRAST TECHNIQUE:  Multidetector CT imaging of the abdomen and pelvis was performed using the standard protocol following bolus administration of intravenous contrast. CONTRAST:  171mL ISOVUE-300 IOPAMIDOL (ISOVUE-300) INJECTION 61% COMPARISON:  CT abdomen and pelvis 05/04/2018. FINDINGS: Lower chest: The lung bases are clear without focal nodule, mass, or airspace disease. The heart size is normal. No significant pleural or pericardial effusion is present. Hepatobiliary: Mild fatty infiltration of the liver is again seen. No discrete lesions are present. The common bile duct and gallbladder are normal. Pancreas: Unremarkable. No pancreatic ductal dilatation or surrounding inflammatory changes. Spleen: Normal in size without focal abnormality. Adrenals/Urinary Tract: The adrenal glands are normal bilaterally. A 3 mm nonobstructing stone anteriorly in the right kidney is stable. Left kidney is unremarkable. Ureters are within normal limits bilaterally. The urinary bladder is normal. Stomach/Bowel: The stomach and duodenum are within normal limits. Small bowel is unremarkable. Terminal ileum is normal. Appendix is visualized and normal. Ascending and transverse colon are within normal limits. Diverticular changes are present in the descending colon. Inflammatory changes are again noted about the sigmoid colon compatible with acute diverticulitis. The fluid collection lateral and inferior to the inflamed portion of colon has increased slightly. Maximal dimensions are 5.6 x 3.5 x 1.3 cm. This area would be amenable to percutaneous image guided drainage. Vascular/Lymphatic: Subcentimeter lymph nodes are present within the ileocolic ligament. No significant adenopathy is present. Reproductive: A 4.7 cm fibroid at the fundus of the uterus is again noted. Uterus and adnexa are otherwise unremarkable. Other: No free fluid or free air is present. No significant ventral hernia is present. Musculoskeletal: Vertebral body heights and alignment are  maintained. No focal lytic or blastic lesions are present. Bony pelvis is intact. Hips are located and within normal limits bilaterally. IMPRESSION: 1. Slight increase and fluid collection adjacent to sigmoid diverticulitis. The collection measures 5.6 x 3.5 x 1.3 cm. This likely represents a developing abscess. 2. Extent of inflamed bowel is not significantly changed. 3. Additional diverticular changes in the descending colon without other inflammatory changes. 4. Stable 3 mm nonobstructing stone in the right kidney. Electronically Signed   By: San Morelle M.D.   On: 05/09/2018 10:58   Ct Abdomen Pelvis W Contrast  Result Date: 05/04/2018 CLINICAL DATA:  Lower abdominal pain. Tightness and pressure. Pain is intermittent for 10 days. Nausea. EXAM: CT ABDOMEN AND PELVIS WITH CONTRAST TECHNIQUE: Multidetector CT imaging of the abdomen and pelvis was performed using the standard protocol following bolus administration of intravenous contrast. CONTRAST:  125mL ISOVUE-300 IOPAMIDOL (ISOVUE-300) INJECTION 61% COMPARISON:  12/14/2017 FINDINGS: Lower chest: Mild dependent changes in the lung bases. Hepatobiliary:  No focal liver abnormality is seen. No gallstones, gallbladder wall thickening, or biliary dilatation. Pancreas: Unremarkable. No pancreatic ductal dilatation or surrounding inflammatory changes. Spleen: Normal in size without focal abnormality. Adrenals/Urinary Tract: No adrenal gland nodules. 3 mm stone in the midpole right kidney. No change in size or position since previous study. No hydronephrosis or hydroureter. No ureteral stones. Nephrograms are symmetrical. Bladder wall is not thickened. No bladder filling defects. Stomach/Bowel: Diverticulosis of the sigmoid colon. There is wall thickening in the sigmoid colon with pericolonic stranding and edema consistent with acute cholecystitis. Disorganized fluid collection inferior to the sigmoid colon measuring 2.3 x 6 cm in diameter. This may  represent early abscess formation although no definitive wall has yet formed. Stomach, small bowel, and colon are not abnormally distended. Scattered stool throughout the colon. Mesenteric lymph nodes are likely reactive. Appendix is normal. Vascular/Lymphatic: No significant vascular findings are present. No enlarged abdominal or pelvic lymph nodes. Reproductive: Uterus and bilateral adnexa are unremarkable. Other: No free air in the abdomen. Abdominal wall musculature appears intact. Musculoskeletal: No acute or significant osseous findings. IMPRESSION: 1. Changes of acute diverticulitis involving the sigmoid colon. Disorganized 2.3 x 6 cm fluid collection inferior to the sigmoid colon likely represents early abscess formation. No free air. 2. 3 mm nonobstructing stone in the right kidney. Electronically Signed   By: Lucienne Capers M.D.   On: 05/04/2018 21:57   Mm 3d Screen Breast Bilateral  Result Date: 04/26/2018 CLINICAL DATA:  Screening. EXAM: DIGITAL SCREENING BILATERAL MAMMOGRAM WITH TOMO AND CAD COMPARISON:  Previous exam(s). ACR Breast Density Category b: There are scattered areas of fibroglandular density. FINDINGS: There are no findings suspicious for malignancy. Images were processed with CAD. IMPRESSION: No mammographic evidence of malignancy. A result letter of this screening mammogram will be mailed directly to the patient. RECOMMENDATION: Screening mammogram in one year. (Code:SM-B-01Y) BI-RADS CATEGORY  1: Negative. Electronically Signed   By: Margarette Canada M.D.   On: 04/26/2018 10:23    Labs:  CBC: Recent Labs    12/01/17 1507 04/25/18 1644 05/04/18 1805 05/06/18 0457  WBC 6.4 11.2* 9.5 7.0  HGB 12.4 13.2 12.5 12.0  HCT 37.3 38.6 36.8 34.6*  PLT 405* 365 399 354    COAGS: Recent Labs    05/10/18 0539  INR 1.04  APTT 31    BMP: Recent Labs    12/01/17 1507 04/25/18 1644 05/04/18 1805 05/06/18 0457  NA 138 136 138 141  K 4.6 4.4 3.7 3.5  CL 104 99* 105 108    CO2 29 25 25 25   GLUCOSE 83 102* 116* 88  BUN 17 17 13 6   CALCIUM 9.6 9.3 8.9 9.0  CREATININE 0.86 0.87 0.65 0.61  GFRNONAA 75 >60 >60 >60  GFRAA 87 >60 >60 >60    LIVER FUNCTION TESTS: Recent Labs    12/01/17 1507 05/04/18 1805  BILITOT 0.4 0.3  AST 14 27  ALT 19 37  ALKPHOS  --  102  PROT 7.3 8.0  ALBUMIN  --  4.1    TUMOR MARKERS: No results for input(s): AFPTM, CEA, CA199, CHROMGRNA in the last 8760 hours.  Assessment and Plan:  57 yo with sigmoid diverticulitis and concern for a developing abscess in left lower abdomen/pelvis.  This fluid collection is amendable to CT guided aspiration and drain placement.  Discussed procedure with patient.  She understands that she will likely go home with drain and may keep it for several weeks.  Informed consent obtained.  Plan for CT guided aspiration and drainage of left abdominal/pelvis fluid collection.    Thank you for this interesting consult.  I greatly enjoyed meeting Margaret Kramer and look forward to participating in their care.  A copy of this report was sent to the requesting provider on this date.  Electronically Signed: Burman Riis, MD 05/10/2018, 11:25 AM   I spent a total of 20 Minutes    in face to face in clinical consultation, greater than 50% of which was counseling/coordinating care for diverticular abscess.

## 2018-05-14 ENCOUNTER — Telehealth: Payer: Self-pay

## 2018-05-14 ENCOUNTER — Other Ambulatory Visit: Payer: Self-pay

## 2018-05-14 LAB — BODY FLUID CULTURE
CULTURE: NO GROWTH
SPECIAL REQUESTS: NORMAL

## 2018-05-14 NOTE — Telephone Encounter (Signed)
Flagged on EMMI report for having unfilled prescriptions and other questions or problems.  First attempt to reach patient made 05/14/18 at 3:25pm, however unable to reach.  Left message encouraging callback.  Will attempt at a later time.

## 2018-05-16 ENCOUNTER — Ambulatory Visit (INDEPENDENT_AMBULATORY_CARE_PROVIDER_SITE_OTHER): Payer: BLUE CROSS/BLUE SHIELD | Admitting: Surgery

## 2018-05-16 ENCOUNTER — Encounter: Payer: Self-pay | Admitting: Surgery

## 2018-05-16 VITALS — BP 135/88 | HR 93 | Temp 98.3°F | Ht 62.0 in | Wt 172.0 lb

## 2018-05-16 DIAGNOSIS — K5732 Diverticulitis of large intestine without perforation or abscess without bleeding: Secondary | ICD-10-CM

## 2018-05-16 NOTE — Discharge Summary (Signed)
Physician Discharge Summary  Patient ID: Margaret Kramer MRN: [id number] DOB/AGE: 06/06/1961 57 y.o.  Admit date: 05/04/2018 Discharge date: 05/10/2018  Admission Diagnoses:  Discharge Diagnoses:  Active Problems:   Diverticulitis of large intestine with abscess   Colonic diverticular abscess   Discharged Condition: good  Hospital Course: 57 y.o. female presented to [facility] ED for abdominal pain. Workup was found to be significant for CT imaging demonstrating recurrent sigmoid colonic diverticulitis with abscess, initially thought to be not amenable to image-guided drainage per IR. Patient was accordingly treated with antibiotics alone, with which her pain quickly improved and her leukocytosis resolved. Advancement of patient's diet and ambulation were well-tolerated. CT was repeated on the day of planned discharge with the hope of maximizing interval between imaging studies and for possibility of image-guided abscess drainage prior to discharge. This time, fluid collection was believed to be amenable to image-guided drainage, which was performed with clear yellow non-purulent fluid aspirated (reported to be more consistent with adnexal cyst than diverticular abscess) without drain left. The remainder of patient's hospital course was essentially unremarkable, and discharge planning was initiated accordingly with patient safely able to be discharged home with appropriate discharge instructions, antibiotics, and outpatient surgical follow-up after all of her questions were answered to her expressed satisfaction.  Consults: IR for image-guided drainage of pelvic fluid collection  Significant Diagnostic Studies: radiology: CT scan: sigmoid colonic diverticulitis with pelvic fluid collection  Treatments: antibiotics: Zosyn and procedures: image-guided drainage of pelvic fluid collection [facility], 05/10/2018)  Discharge Exam: Blood pressure 132/69, pulse 62, temperature 98.1 F (36.7 C), temperature  source Oral, resp. rate 11, height 5\' 2"  (1.575 m), weight 176 lb 12.9 oz (80.2 kg), SpO2 99 %. General appearance: alert, cooperative and no distress GI: soft, non-tender; bowel sounds normal; no masses,  no organomegaly  Disposition:    Allergies as of 05/10/2018   No Known Allergies     Medication List    TAKE these medications   amoxicillin-clavulanate 875-125 MG tablet Commonly known as:  AUGMENTIN Take 1 tablet by mouth 2 (two) times daily for 10 days. What changed:  when to take this   fluconazole 150 MG tablet Commonly known as:  DIFLUCAN Take 1 tablet (150 mg total) by mouth daily. Take one pill orally, then Repeat in 72 hrs as needed.   ibuprofen 800 MG tablet Commonly known as:  ADVIL,MOTRIN Take 1 tablet (800 mg total) by mouth 3 (three) times daily.   multivitamin tablet Take 1 tablet by mouth daily.   Vitamin D 2000 units Caps Take 1 capsule by mouth daily.      Follow-up Information    Vickie Epley, MD. Go on 05/16/2018.   Specialty:  General Surgery Why:  Go at 2:15pm. Contact information: [address] [place] [address] [phone]           Signed: Vickie Epley 05/16/2018, 7:29 AM

## 2018-05-16 NOTE — Progress Notes (Signed)
Surgical Clinic Progress/Follow-up Note   HPI:  57 y.o. Female presents to clinic for follow-up evaluation s/p recent hospital admission for acute sigmoid colonic diverticulitis with pelvic fluid collection not consistent with abscess when drained. Patient reports improving appetite and tolerating diet, though with fear of eating the "wrong foods" and constipation despite +flatus, denies LLQ or suprapubic abdominal pain (describes mild LLQ "pressure"), N/V, fever/chills, CP, or SOB.  Review of Systems:  Constitutional: denies any other weight loss, fever, chills, or sweats  Eyes: denies any other vision changes, history of eye injury  ENT: denies sore throat, hearing problems  Respiratory: denies shortness of breath, wheezing  Cardiovascular: denies chest pain, palpitations  Gastrointestinal: abdominal pain, N/V, and bowel function as per HPI Musculoskeletal: denies any other joint pains or cramps  Skin: Denies any other rashes or skin discolorations  Neurological: denies any other headache, dizziness, weakness  Psychiatric: denies any other depression, anxiety  All other review of systems: otherwise negative   Vital Signs:  BP 135/88   Pulse 93   Temp 98.3 F (36.8 C) (Oral)   Ht 5\' 2"  (1.575 m)   Wt 172 lb (78 kg)   BMI 31.46 kg/m    Physical Exam:  Constitutional:  -- Overweight body habitus  -- Awake, alert, and oriented x3  Eyes:  -- Pupils equally round and reactive to light  -- No scleral icterus  Ear, nose, throat:  -- No jugular venous distension  -- No nasal drainage, bleeding Pulmonary:  -- No crackles -- Equal breath sounds bilaterally -- Breathing non-labored at rest Cardiovascular:  -- S1, S2 present  -- No pericardial rubs  Gastrointestinal:  -- Soft, nontender, non-distended, no guarding/rebound  -- No abdominal masses appreciated, pulsatile or otherwise  Musculoskeletal / Integumentary:  -- Wounds or skin discoloration: None appreciated  --  Extremities: B/L UE and LE FROM, hands and feet warm, no edema  Neurologic:  -- Motor function: intact and symmetric  -- Sensation: intact and symmetric   Assessment:  57 y.o. yo Female with a problem list including...  Patient Active Problem List   Diagnosis Date Noted  . Colonic diverticular abscess   . Diverticulitis of large intestine with abscess 05/04/2018  . Hallux valgus, acquired 03/07/2018  . Mild depression ([place]) 12/29/2016  . Other insomnia 12/29/2016  . Cardiac murmur 02/10/2016  . Hypertension, benign 02/10/2016  . History of syncope 02/10/2016  . Prediabetes 02/10/2016  . Increased pressure in the eye 02/10/2016  . Vitamin D deficiency 02/10/2016  . Obesity (BMI 30.0-34.9) 02/10/2016    presents to clinic for follow-up evaluation of long-interval recurrent sigmoid colonic diverticulitis, complicated by comorbidities including constipation.  Plan:   - complete prescribed course of antibiotics   - GI referral made for overdue colonoscopy as per patient's request  - maintain hydration and once daily stool softener with once daily Miralax until daily BM's  - return to clinic following colonoscopy to discuss results and possible surgery  - instructed to call office if any questions or concerns  All of the above recommendations were discussed with the patient, and all of patient's questions were answered to her expressed satisfaction.  -- Marilynne Drivers Rosana Hoes, MD, [place]: [facility] - Partnering for exceptional care. Office: [phone]

## 2018-05-16 NOTE — Telephone Encounter (Signed)
Reached patient upon second attempt on 05/16/18 at 11:13am.  She mentioned she was able to fill her prescriptions and did not have any questions or concerns at this time.  Reports that she has an appointment with Dr. Rosana Hoes this afternoon.  I thanked her for her time and for participating in the [facility] callback series.

## 2018-05-16 NOTE — Patient Instructions (Addendum)
We will send the referral to [place]. Someone from their office will call to schedule an appointment. If you have not heard from anyone within 5 days please call our office and let us know and we can check on this for you.  Please see your follow up appointment after you have your Colonoscopy done listed below.    Please take Colace or Miralax daily. If your stool becomes watery then you may want to take every other day.  Please eat a low fiber diet.  Please be sure to increase your water intake daily.   Low-Fiber Diet Fiber is found in fruits, vegetables, and whole grains. A low-fiber diet restricts fibrous foods that are not digested in the small intestine. A diet containing about 10-15 grams of fiber per day is considered low fiber. Low-fiber diets may be used to:  Promote healing and rest the bowel during intestinal flare-ups.  Prevent blockage of a partially obstructed or narrowed gastrointestinal tract.  Reduce fecal weight and volume.  Slow the movement of feces.  You may be on a low-fiber diet as a transitional diet following surgery, after an injury (trauma), or because of a short (acute) or lifelong (chronic) illness. Your health care provider will determine the length of time you need to stay on this diet. What do I need to know about a low-fiber diet? Always check the fiber content on the packaging's Nutrition Facts label, especially on foods from the grains list. Ask your dietitian if you have questions about specific foods that are related to your condition, especially if the food is not listed below. In general, a low-fiber food will have less than 2 g of fiber. What foods can I eat? Grains All breads and crackers made with white flour. Sweet rolls, doughnuts, waffles, pancakes, [place] toast, bagels. Pretzels, Melba toast, zwieback. Well-cooked cereals, such as cornmeal, farina, or cream cereals. Dry cereals that do not contain whole grains, fruit, or nuts,  such as refined corn, wheat, rice, and oat cereals. Potatoes prepared any way without skins, plain pastas and noodles, refined white rice. Use white flour for baking and making sauces. Use allowed list of grains for casseroles, dumplings, and puddings. Vegetables Strained tomato and vegetable juices. Fresh lettuce, cucumber, spinach. Well-cooked (no skin or pulp) or canned vegetables, such as asparagus, bean sprouts, beets, carrots, green beans, mushrooms, potatoes, pumpkin, spinach, yellow squash, tomato sauce/puree, turnips, yams, and zucchini. Keep servings limited to  cup. Fruits All fruit juices except prune juice. Cooked or canned fruits without skin and seeds, such as applesauce, apricots, cherries, fruit cocktail, grapefruit, grapes, mandarin oranges, melons, peaches, pears, pineapple, and plums. Fresh fruits without skin, such as apricots, avocados, bananas, melons, pineapple, nectarines, and peaches. Keep servings limited to  cup or 1 piece. Meat and Other Protein Sources Ground or well-cooked tender beef, ham, veal, lamb, pork, or poultry. Eggs, plain cheese. Fish, oysters, shrimp, lobster, and other seafood. Liver, organ meats. Smooth nut butters. Dairy All milk products and alternative dairy substitutes, such as soy, rice, almond, and coconut, not containing added whole nuts, seeds, or added fruit. Beverages Decaf coffee, fruit, and vegetable juices or smoothies (small amounts, with no pulp or skins, and with fruits from allowed list), sports drinks, herbal tea. Condiments Ketchup, mustard, vinegar, cream sauce, cheese sauce, cocoa powder. Spices in moderation, such as allspice, basil, bay leaves, celery powder or leaves, cinnamon, cumin powder, curry powder, ginger, mace, marjoram, onion or garlic powder, oregano, paprika, parsley flakes, ground pepper, rosemary,  sage, savory, tarragon, thyme, and turmeric. Sweets and Desserts Plain cakes and cookies, pie made with allowed fruit,  pudding, custard, cream pie. Gelatin, fruit, ice, sherbet, frozen ice pops. Ice cream, ice milk without nuts. Plain hard candy, honey, jelly, molasses, syrup, sugar, chocolate syrup, gumdrops, marshmallows. Limit overall sugar intake. Fats and Oil Margarine, butter, cream, mayonnaise, salad oils, plain salad dressings made from allowed foods. Choose healthy fats such as olive oil, canola oil, and omega-3 fatty acids (such as found in salmon or tuna) when possible. Other Bouillon, broth, or cream soups made from allowed foods. Any strained soup. Casseroles or mixed dishes made with allowed foods. The items listed above may not be a complete list of recommended foods or beverages. Contact your dietitian for more options. What foods are not recommended? Grains All whole wheat and whole grain breads and crackers. Multigrains, rye, bran seeds, nuts, or coconut. Cereals containing whole grains, multigrains, bran, coconut, nuts, raisins. Cooked or dry oatmeal, steel-cut oats. Coarse wheat cereals, granola. Cereals advertised as high fiber. Potato skins. Whole grain pasta, wild or brown rice. Popcorn. Coconut flour. Bran, buckwheat, corn bread, multigrains, rye, wheat germ. Vegetables Fresh, cooked or canned vegetables, such as artichokes, asparagus, beet greens, broccoli, Brussels sprouts, cabbage, celery, cauliflower, corn, eggplant, kale, legumes or beans, okra, peas, and tomatoes. Avoid large servings of any vegetables, especially raw vegetables. Fruits Fresh fruits, such as apples with or without skin, berries, cherries, figs, grapes, grapefruit, guavas, kiwis, mangoes, oranges, papayas, pears, persimmons, pineapple, and pomegranate. Prune juice and juices with pulp, stewed or dried prunes. Dried fruits, dates, raisins. Fruit seeds or skins. Avoid large servings of all fresh fruits. Meats and Other Protein Sources Tough, fibrous meats with gristle. Chunky nut butter. Cheese made with seeds, nuts, or other  foods not recommended. Nuts, seeds, legumes (beans, including baked beans), dried peas, beans, lentils. Dairy Yogurt or cheese that contains nuts, seeds, or added fruit. Beverages Fruit juices with high pulp, prune juice. Caffeinated coffee and teas. Condiments Coconut, maple syrup, pickles, olives. Sweets and Desserts Desserts, cookies, or candies that contain nuts or coconut, chunky peanut butter, dried fruits. Jams, preserves with seeds, marmalade. Large amounts of sugar and sweets. Any other dessert made with fruits from the not recommended list. Other Soups made from vegetables that are not recommended or that contain other foods not recommended. The items listed above may not be a complete list of foods and beverages to avoid. Contact your dietitian for more information. This information is not intended to replace advice given to you by your health care provider. Make sure you discuss any questions you have with your health care provider. Document Released: 04/22/2002 Document Revised: 04/07/2016 Document Reviewed: 09/23/2013 Elsevier Interactive Patient Education  2017 Elsevier Inc.   Diverticulitis Diverticulitis is when small pockets in your large intestine (colon) get infected or swollen. This causes stomach pain and watery poop (diarrhea). These pouches are called diverticula. They form in people who have a condition called diverticulosis. Follow these instructions at home: Medicines  Take over-the-counter and prescription medicines only as told by your doctor. These include: ? Antibiotics. ? Pain medicines. ? Fiber pills. ? Probiotics. ? Stool softeners.  Do not drive or use heavy machinery while taking prescription pain medicine.  If you were prescribed an antibiotic, take it as told. Do not stop taking it even if you feel better. General instructions  Follow a diet as told by your doctor.  When you feel better, your doctor may tell you  to change your diet. You may  need to eat a lot of fiber. Fiber makes it easier to poop (have bowel movements). Healthy foods with fiber include: ? Berries. ? Beans. ? Lentils. ? Green vegetables.  Exercise 3 or more times a week. Aim for 30 minutes each time. Exercise enough to sweat and make your heart beat faster.  Keep all follow-up visits as told. This is important. You may need to have an exam of the large intestine. This is called a colonoscopy. Contact a doctor if:  Your pain does not get better.  You have a hard time eating or drinking.  You are not pooping like normal. Get help right away if:  Your pain gets worse.  Your problems do not get better.  Your problems get worse very fast.  You have a fever.  You throw up (vomit) more than one time.  You have poop that is: ? Bloody. ? Black. ? Tarry. Summary  Diverticulitis is when small pockets in your large intestine (colon) get infected or swollen.  Take medicines only as told by your doctor.  Follow a diet as told by your doctor. This information is not intended to replace advice given to you by your health care provider. Make sure you discuss any questions you have with your health care provider. Document Released: 04/18/2008 Document Revised: 11/17/2016 Document Reviewed: 11/17/2016 Elsevier Interactive Patient Education  2017 [employer].

## 2018-05-21 ENCOUNTER — Inpatient Hospital Stay: Payer: Self-pay | Admitting: Surgery

## 2018-05-30 DIAGNOSIS — H40053 Ocular hypertension, bilateral: Secondary | ICD-10-CM | POA: Diagnosis not present

## 2018-05-30 LAB — HM DIABETES EYE EXAM

## 2018-06-01 ENCOUNTER — Encounter: Payer: Self-pay | Admitting: Family Medicine

## 2018-06-05 ENCOUNTER — Telehealth: Payer: Self-pay

## 2018-06-05 DIAGNOSIS — Z8719 Personal history of other diseases of the digestive system: Secondary | ICD-10-CM | POA: Diagnosis not present

## 2018-06-05 NOTE — Telephone Encounter (Signed)
FMLA paperwork completed and placed in envelope as the patient will pick up. Placed copy in scan folder.

## 2018-07-04 ENCOUNTER — Encounter: Payer: Self-pay | Admitting: *Deleted

## 2018-07-04 ENCOUNTER — Ambulatory Visit: Payer: BLUE CROSS/BLUE SHIELD | Admitting: Certified Registered"

## 2018-07-04 ENCOUNTER — Encounter
Admission: RE | Disposition: A | Discharge status: Home / Self Care | Payer: Self-pay | Source: Ambulatory Visit | Attending: Internal Medicine

## 2018-07-04 ENCOUNTER — Ambulatory Visit
Admission: RE | Admit: 2018-07-04 | Discharge: 2018-07-04 | Disposition: A | Discharge status: Home / Self Care | Payer: BLUE CROSS/BLUE SHIELD | Source: Ambulatory Visit | Attending: Internal Medicine | Admitting: Internal Medicine

## 2018-07-04 DIAGNOSIS — K5730 Diverticulosis of large intestine without perforation or abscess without bleeding: Secondary | ICD-10-CM | POA: Diagnosis not present

## 2018-07-04 DIAGNOSIS — Z79899 Other long term (current) drug therapy: Secondary | ICD-10-CM | POA: Diagnosis not present

## 2018-07-04 DIAGNOSIS — Z09 Encounter for follow-up examination after completed treatment for conditions other than malignant neoplasm: Secondary | ICD-10-CM | POA: Insufficient documentation

## 2018-07-04 DIAGNOSIS — E559 Vitamin D deficiency, unspecified: Secondary | ICD-10-CM | POA: Diagnosis not present

## 2018-07-04 DIAGNOSIS — K5790 Diverticulosis of intestine, part unspecified, without perforation or abscess without bleeding: Secondary | ICD-10-CM | POA: Diagnosis not present

## 2018-07-04 DIAGNOSIS — I10 Essential (primary) hypertension: Secondary | ICD-10-CM | POA: Diagnosis not present

## 2018-07-04 DIAGNOSIS — Z8719 Personal history of other diseases of the digestive system: Secondary | ICD-10-CM | POA: Diagnosis not present

## 2018-07-04 HISTORY — DX: Diverticulitis of intestine, part unspecified, without perforation or abscess without bleeding: K57.92

## 2018-07-04 HISTORY — PX: COLONOSCOPY WITH PROPOFOL: SHX5780

## 2018-07-04 HISTORY — DX: Major depressive disorder, single episode, unspecified: F32.9

## 2018-07-04 HISTORY — DX: Dyspnea, unspecified: R06.00

## 2018-07-04 HISTORY — DX: Ocular hypertension, unspecified eye: H40.059

## 2018-07-04 HISTORY — DX: Prediabetes: R73.03

## 2018-07-04 HISTORY — DX: Vitamin D deficiency, unspecified: E55.9

## 2018-07-04 HISTORY — DX: Personal history of other specified conditions: Z87.898

## 2018-07-04 SURGERY — COLONOSCOPY WITH PROPOFOL
Anesthesia: General

## 2018-07-04 MED ORDER — PROPOFOL 500 MG/50ML IV EMUL
INTRAVENOUS | Status: DC | PRN
  Administered 2018-07-04: 130 ug/kg/min via INTRAVENOUS

## 2018-07-04 MED ORDER — SODIUM CHLORIDE 0.9 % IV SOLN
INTRAVENOUS | Status: DC
  Administered 2018-07-04: 14:00:00 via INTRAVENOUS

## 2018-07-04 MED ORDER — SODIUM CHLORIDE 0.9 % IJ SOLN
INTRAMUSCULAR | Status: AC
  Filled 2018-07-04: qty 10

## 2018-07-04 MED ORDER — LIDOCAINE HCL (PF) 2 % IJ SOLN
INTRAMUSCULAR | Status: AC
  Filled 2018-07-04: qty 10

## 2018-07-04 MED ORDER — PROPOFOL 10 MG/ML IV BOLUS
INTRAVENOUS | Status: DC | PRN
  Administered 2018-07-04: 80 mg via INTRAVENOUS

## 2018-07-04 MED ORDER — LIDOCAINE HCL (CARDIAC) PF 100 MG/5ML IV SOSY
PREFILLED_SYRINGE | INTRAVENOUS | Status: DC | PRN
  Administered 2018-07-04: 50 mg via INTRAVENOUS

## 2018-07-04 MED ORDER — PROPOFOL 500 MG/50ML IV EMUL
INTRAVENOUS | Status: AC
  Filled 2018-07-04: qty 50

## 2018-07-04 NOTE — Transfer of Care (Signed)
Immediate Anesthesia Transfer of Care Note  Patient: Margaret Kramer  Procedure(s) Performed: COLONOSCOPY WITH PROPOFOL (N/A )  Patient Location: PACU  Anesthesia Type:General  Level of Consciousness: sedated  Airway & Oxygen Therapy: Patient Spontanous Breathing and Patient connected to nasal cannula oxygen  Post-op Assessment: Report given to RN and Post -op Vital signs reviewed and stable  Post vital signs: Reviewed and stable  Last Vitals:  Vitals Value Taken Time  BP 112/68 07/04/2018  2:51 PM  Temp 36.6 C 07/04/2018  2:49 PM  Pulse 73 07/04/2018  2:51 PM  Resp 11 07/04/2018  2:51 PM  SpO2 100 % 07/04/2018  2:51 PM    Last Pain:  Vitals:   07/04/18 1449  TempSrc: Tympanic  PainSc: Asleep         Complications: No apparent anesthesia complications

## 2018-07-04 NOTE — Interval H&P Note (Signed)
History and Physical Interval Note:  07/04/2018 1:58 PM  Margaret Kramer  has presented today for surgery, with the diagnosis of HX.OF CHRONIC DIVERTICULITIS  The various methods of treatment have been discussed with the patient and family. After consideration of risks, benefits and other options for treatment, the patient has consented to  Procedure(s): COLONOSCOPY WITH PROPOFOL (N/A) as a surgical intervention .  The patient's history has been reviewed, patient examined, no change in status, stable for surgery.  I have reviewed the patient's chart and labs.  Questions were answered to the patient's satisfaction.     [place], [place]

## 2018-07-04 NOTE — Anesthesia Procedure Notes (Signed)
Performed by: Cordarro Spinnato, CRNA Pre-anesthesia Checklist: Patient identified, Emergency Drugs available, Suction available, Patient being monitored and Timeout performed Patient Re-evaluated:Patient Re-evaluated prior to induction Oxygen Delivery Method: Nasal cannula Induction Type: IV induction       

## 2018-07-04 NOTE — Anesthesia Preprocedure Evaluation (Addendum)
Anesthesia Evaluation  Patient identified by MRN, date of birth, ID band Patient awake    Reviewed: Allergy & Precautions, H&P , NPO status , Patient's Chart, lab work & pertinent test results  Airway Mallampati: I  TM Distance: >3 FB Neck ROM: full    Dental no notable dental hx. (+) Teeth Intact   Pulmonary neg pulmonary ROS, neg shortness of breath,           Cardiovascular hypertension, (-) angina(-) Past MI, (-) Cardiac Stents and (-) DOE negative cardio ROS  (-) dysrhythmias + Valvular Problems/Murmurs (murmur)      Neuro/Psych PSYCHIATRIC DISORDERS Depression negative neurological ROS  negative psych ROS   GI/Hepatic negative GI ROS, Neg liver ROS,   Endo/Other  negative endocrine ROS  Renal/GU negative Renal ROS  negative genitourinary   Musculoskeletal   Abdominal   Peds  Hematology negative hematology ROS (+)   Anesthesia Other Findings Past Medical History: No date: Allergy 12/29/2016: Depression     Comment:  mild No date: Diverticulitis No date: Dyspnea No date: History of syncope No date: Hypertension 02/10/2016: Increased pressure in the eye No date: Vitamin D deficiency  Past Surgical History: No date: COLONOSCOPY No date: ganglion cyst removal; Right  BMI    Body Mass Index:  31.46 kg/m      Reproductive/Obstetrics negative OB ROS                            Anesthesia Physical Anesthesia Plan  ASA: II  Anesthesia Plan: General   Post-op Pain Management:    Induction:   PONV Risk Score and Plan: Propofol infusion  Airway Management Planned:   Additional Equipment:   Intra-op Plan:   Post-operative Plan:   Informed Consent: I have reviewed the patients History and Physical, chart, labs and discussed the procedure including the risks, benefits and alternatives for the proposed anesthesia with the patient or authorized representative who has  indicated his/her understanding and acceptance.   Dental Advisory Given  Plan Discussed with: Anesthesiologist, CRNA and Surgeon  Anesthesia Plan Comments:        Anesthesia Quick Evaluation

## 2018-07-04 NOTE — Op Note (Signed)
[facility] Gastroenterology Patient Name: Margaret Kramer Procedure Date: 07/04/2018 1:51 PM MRN: [id number] Account #: [id number] Date of Birth: 1961/01/28 Admit Type: Outpatient Age: 57 Room: [facility] ENDO ROOM 4 Gender: Female Note Status: Finalized Procedure:            Colonoscopy Indications:          Follow-up of diverticulitis Providers:            Benay Pike. [place] MD, MD Medicines:            Propofol per Anesthesia Complications:        No immediate complications. Procedure:            Pre-Anesthesia Assessment:                       - The risks and benefits of the procedure and the                        sedation options and risks were discussed with the                        patient. All questions were answered and informed                        consent was obtained.                       - Patient identification and proposed procedure were                        verified prior to the procedure by the nurse. The                        procedure was verified in the procedure room.                       - ASA Grade Assessment: II - A patient with mild                        systemic disease.                       - After reviewing the risks and benefits, the patient                        was deemed in satisfactory condition to undergo the                        procedure.                       After obtaining informed consent, the colonoscope was                        passed under direct vision. Throughout the procedure,                        the patient's blood pressure, pulse, and oxygen                        saturations were monitored continuously. The  Colonoscope was introduced through the anus and                        advanced to the the cecum, identified by appendiceal                        orifice and ileocecal valve. The colonoscopy was                        performed without difficulty. The patient tolerated the                         procedure well. The quality of the bowel preparation                        was excellent. The ileocecal valve, appendiceal                        orifice, and rectum were photographed. Findings:      The perianal and digital rectal examinations were normal. Pertinent       negatives include normal sphincter tone and no palpable rectal lesions.      Multiple small and large-mouthed diverticula were found in the sigmoid       colon, descending colon and transverse colon.      The exam was otherwise without abnormality on direct and retroflexion       views. Impression:           - Diverticulosis in the sigmoid colon, in the                        descending colon and in the transverse colon.                       - The examination was otherwise normal on direct and                        retroflexion views.                       - No specimens collected. Recommendation:       - Patient has a contact number available for                        emergencies. The signs and symptoms of potential                        delayed complications were discussed with the patient.                        Return to normal activities tomorrow. Written discharge                        instructions were provided to the patient.                       - Resume previous diet.                       - Continue present medications.                       -  Repeat colonoscopy in 10 years for screening purposes.                       - Return to GI office PRN.                       - The findings and recommendations were discussed with                        the patient and their family. Procedure Code(s):    --- Professional ---                       [phone], Colonoscopy, flexible; diagnostic, including                        collection of specimen(s) by brushing or washing, when                        performed (separate procedure) Diagnosis Code(s):    --- Professional ---                        K57.30, Diverticulosis of large intestine without                        perforation or abscess without bleeding                       K57.32, Diverticulitis of large intestine without                        perforation or abscess without bleeding CPT copyright 2017 American Medical Association. All rights reserved. The codes documented in this report are preliminary and upon coder review may  be revised to meet current compliance requirements. Efrain Sella MD, MD 07/04/2018 2:51:29 PM This report has been signed electronically. Number of Addenda: 0 Note Initiated On: 07/04/2018 1:51 PM Scope Withdrawal Time: 0 hours 6 minutes 0 seconds  Total Procedure Duration: 0 hours 12 minutes 47 seconds       [facility]

## 2018-07-04 NOTE — Anesthesia Post-op Follow-up Note (Signed)
Anesthesia QCDR form completed.        

## 2018-07-04 NOTE — H&P (Signed)
Outpatient short stay form Pre-procedure 07/04/2018 1:56 PM Teodoro K. Alice Reichert, M.D.  Primary Physician: Steele Sizer, M.D.  Reason for visit:  followup diverticulitis  History of present illness:  Patient is a pleasant 58 y/o female presenting after inpatient treatment of second episode of diverticulitis. Patient appeared to respond well to IV and po antibiotics and now denies any upper or lower GI symptoms.     Current Facility-Administered Medications:  .  0.9 %  sodium chloride infusion, , Intravenous, Continuous, [place], Benay Pike, MD  Medications Prior to Admission  Medication Sig Dispense Refill Last Dose  . Cholecalciferol (VITAMIN D) 2000 units CAPS Take 1 capsule by mouth daily.   Taking  . dicyclomine (BENTYL) 10 MG capsule Take 1 capsule by mouth 1 day or 1 dose.   Not Taking at Unknown time  . fluconazole (DIFLUCAN) 150 MG tablet Take 1 tablet (150 mg total) by mouth daily. Take one pill orally, then Repeat in 72 hrs as needed. (Patient not taking: Reported on 07/04/2018) 2 tablet 0 Not Taking at Unknown time  . ibuprofen (ADVIL,MOTRIN) 800 MG tablet Take 1 tablet (800 mg total) by mouth 3 (three) times daily. (Patient not taking: Reported on 07/04/2018) 30 tablet 0 Not Taking at Unknown time  . Multiple Vitamin (MULTIVITAMIN) tablet Take 1 tablet by mouth daily.   07/02/2018     No Known Allergies   Past Medical History:  Diagnosis Date  . Allergy   . Depression 12/29/2016   mild  . Diverticulitis   . Dyspnea   . History of syncope   . Hypertension   . Increased pressure in the eye 02/10/2016  . Pre-diabetes   . Vitamin D deficiency     Review of systems:  Otherwise negative.    Physical Exam  Gen: Alert, oriented. Appears stated age.  HEENT: [place]/AT. PERRLA. Lungs: CTA, no wheezes. CV: RR nl S1, S2. Abd: soft, benign, no masses. BS+ Ext: No edema. Pulses 2+    Planned procedures: Proceed with colonoscopy. The patient understands the nature of the  planned procedure, indications, risks, alternatives and potential complications including but not limited to bleeding, infection, perforation, damage to internal organs and possible oversedation/side effects from anesthesia. The patient agrees and gives consent to proceed.  Please refer to procedure notes for findings, recommendations and patient disposition/instructions.     Teodoro K. Alice Reichert, M.D. Gastroenterology 07/04/2018  1:56 PM

## 2018-07-05 ENCOUNTER — Encounter: Payer: Self-pay | Admitting: Internal Medicine

## 2018-07-05 NOTE — Anesthesia Postprocedure Evaluation (Addendum)
Anesthesia Post Note  Patient: Margaret Kramer  Procedure(s) Performed: COLONOSCOPY WITH PROPOFOL (N/A )  Patient location during evaluation: PACU Anesthesia Type: General Level of consciousness: awake and alert Pain management: pain level controlled Vital Signs Assessment: post-procedure vital signs reviewed and stable Respiratory status: spontaneous breathing, nonlabored ventilation and respiratory function stable Cardiovascular status: blood pressure returned to baseline and stable Postop Assessment: no apparent nausea or vomiting Anesthetic complications: no     Last Vitals:  Vitals:   07/04/18 1519 07/04/18 1529  BP: 137/76 135/74  Pulse: 71 66  Resp: 13 20  Temp:    SpO2: 100% 100%    Last Pain:  Vitals:   07/04/18 1529  TempSrc:   PainSc: 0-No pain                 Durenda Hurt

## 2018-07-12 ENCOUNTER — Encounter: Payer: Self-pay | Admitting: Surgery

## 2018-07-12 ENCOUNTER — Ambulatory Visit: Payer: BLUE CROSS/BLUE SHIELD | Admitting: Surgery

## 2018-07-12 VITALS — BP 137/83 | HR 82 | Temp 97.9°F | Wt 172.0 lb

## 2018-07-12 DIAGNOSIS — K5732 Diverticulitis of large intestine without perforation or abscess without bleeding: Secondary | ICD-10-CM

## 2018-07-12 NOTE — Patient Instructions (Signed)
Please give [place] a call in case you have any questions or concerns.  Please start taking Colace (stool softeners) one tablet once a day to help with constipation.  Docusate capsules What is this medicine? DOCUSATE (doc CUE sayt) is stool softener. It helps prevent constipation and straining or discomfort associated with hard or dry stools. This medicine may be used for other purposes; ask your health care provider or pharmacist if you have questions. COMMON BRAND NAME(S): Colace, Colace Clear, Correctol, D.O.S., DC, Doc-Q-Lace, DocuLace, Docusoft S, DOK, DOK Extra Strength, Dulcolax, Genasoft, Kao-Tin, Kaopectate Liqui-Gels, Phillips Stool Softener, Stool Softener, Stool Softner DC, Sulfolax, Sur-Q-Lax, Surfak, Uni-Ease What should I tell my health care provider before I take this medicine? They need to know if you have any of these conditions: -nausea or vomiting -severe constipation -stomach pain -sudden change in bowel habit lasting more than 2 weeks -an unusual or allergic reaction to docusate, other medicines, foods, dyes, or preservatives -pregnant or trying to get pregnant -breast-feeding How should I use this medicine? Take this medicine by mouth with a glass of water. Follow the directions on the label. Take your doses at regular intervals. Do not take your medicine more often than directed. Talk to your pediatrician regarding the use of this medicine in children. While this medicine may be prescribed for children as young as 2 years for selected conditions, precautions do apply. Overdosage: If you think you have taken too much of this medicine contact a poison control center or emergency room at once. NOTE: This medicine is only for you. Do not share this medicine with others. What if I miss a dose? If you miss a dose, take it as soon as you can. If it is almost time for your next dose, take only that dose. Do not take double or extra doses. What may interact with this  medicine? -mineral oil This list may not describe all possible interactions. Give your health care provider a list of all the medicines, herbs, non-prescription drugs, or dietary supplements you use. Also tell them if you smoke, drink alcohol, or use illegal drugs. Some items may interact with your medicine. What should I watch for while using this medicine? Do not use for more than one week without advice from your doctor or health care professional. If your constipation returns, check with your doctor or health care professional. Drink plenty of water while taking this medicine. Drinking water helps decrease constipation. Stop using this medicine and contact your doctor or health care professional if you experience any rectal bleeding or do not have a bowel movement after use. These could be signs of a more serious condition. What side effects may I notice from receiving this medicine? Side effects that you should report to your doctor or health care professional as soon as possible: -allergic reactions like skin rash, itching or hives, swelling of the face, lips, or tongue Side effects that usually do not require medical attention (report to your doctor or health care professional if they continue or are bothersome): -diarrhea -stomach cramps -throat irritation This list may not describe all possible side effects. Call your doctor for medical advice about side effects. You may report side effects to FDA at 1-800-FDA-1088. Where should I keep my medicine? Keep out of the reach of children. Store at room temperature between 15 and 30 degrees C (59 and 86 degrees F). Throw away any unused medicine after the expiration date. NOTE: This sheet is a summary. It may not cover all  possible information. If you have questions about this medicine, talk to your doctor, pharmacist, or health care provider.  2018 Elsevier/Gold Standard (2008-02-21 15:56:49)   Diverticulosis Diverticulosis is a condition  that develops when small pouches (diverticula) form in the wall of the large intestine (colon). The colon is where water is absorbed and stool is formed. The pouches form when the inside layer of the colon pushes through weak spots in the outer layers of the colon. You may have a few pouches or many of them. What are the causes? The cause of this condition is not known. What increases the risk? The following factors may make you more likely to develop this condition:  Being older than age 58. Your risk for this condition increases with age. Diverticulosis is rare among people younger than age 34. By age 52, many people have it.  Eating a low-fiber diet.  Having frequent constipation.  Being overweight.  Not getting enough exercise.  Smoking.  Taking over-the-counter pain medicines, like aspirin and ibuprofen.  Having a family history of diverticulosis.  What are the signs or symptoms? In most people, there are no symptoms of this condition. If you do have symptoms, they may include:  Bloating.  Cramps in the abdomen.  Constipation or diarrhea.  Pain in the lower left side of the abdomen.  How is this diagnosed? This condition is most often diagnosed during an exam for other colon problems. Because diverticulosis usually has no symptoms, it often cannot be diagnosed independently. This condition may be diagnosed by:  Using a flexible scope to examine the colon (colonoscopy).  Taking an X-ray of the colon after dye has been put into the colon (barium enema).  Doing a CT scan.  How is this treated? You may not need treatment for this condition if you have never developed an infection related to diverticulosis. If you have had an infection before, treatment may include:  Eating a high-fiber diet. This may include eating more fruits, vegetables, and grains.  Taking a fiber supplement.  Taking a live bacteria supplement (probiotic).  Taking medicine to relax your  colon.  Taking antibiotic medicines.  Follow these instructions at home:  Drink 6-8 glasses of water or more each day to prevent constipation.  Try not to strain when you have a bowel movement.  If you have had an infection before: ? Eat more fiber as directed by your health care provider or your diet and nutrition specialist (dietitian). ? Take a fiber supplement or probiotic, if your health care provider approves.  Take over-the-counter and prescription medicines only as told by your health care provider.  If you were prescribed an antibiotic, take it as told by your health care provider. Do not stop taking the antibiotic even if you start to feel better.  Keep all follow-up visits as told by your health care provider. This is important. Contact a health care provider if:  You have pain in your abdomen.  You have bloating.  You have cramps.  You have not had a bowel movement in 3 days. Get help right away if:  Your pain gets worse.  Your bloating becomes very bad.  You have a fever or chills, and your symptoms suddenly get worse.  You vomit.  You have bowel movements that are bloody or black.  You have bleeding from your rectum. Summary  Diverticulosis is a condition that develops when small pouches (diverticula) form in the wall of the large intestine (colon).  You may have  a few pouches or many of them.  This condition is most often diagnosed during an exam for other colon problems.  If you have had an infection related to diverticulosis, treatment may include increasing the fiber in your diet, taking supplements, or taking medicines. This information is not intended to replace advice given to you by your health care provider. Make sure you discuss any questions you have with your health care provider. Document Released: 07/28/2004 Document Revised: 09/19/2016 Document Reviewed: 09/19/2016 Elsevier Interactive Patient Education  2017 [employer].

## 2018-07-12 NOTE — Progress Notes (Signed)
Surgical Clinic Progress/Follow-up Note   HPI:  57 y.o. Female presents to clinic for follow-up evaluation after colonoscopy following her first episode of acute sigmoid colonic diverticulitis. Patient reports she is "usually not too constipated", but she describes she since her last appointment took Miralax daily x 1 week due to constipation, since which her BM's have been soft without abdominal pain, N/V, or straining.  Review of Systems:  Constitutional: denies any other weight loss, fever, chills, or sweats  Eyes: denies any other vision changes, history of eye injury  ENT: denies sore throat, hearing problems  Respiratory: denies shortness of breath, wheezing  Cardiovascular: denies chest pain, palpitations  Gastrointestinal: abdominal pain, N/V, and bowel function as per HPI Musculoskeletal: denies any other joint pains or cramps  Skin: Denies any other rashes or skin discolorations  Neurological: denies any other headache, dizziness, weakness  Psychiatric: denies any other depression, anxiety  All other review of systems: otherwise negative   Vital Signs:  BP 137/83   Pulse 82   Temp 97.9 F (36.6 C) (Oral)   Wt 172 lb (78 kg)   BMI 31.46 kg/m    Physical Exam:  Constitutional:  -- Normal body habitus  -- Awake, alert, and oriented x3  Eyes:  -- Pupils equally round and reactive to light  -- No scleral icterus  Ear, nose, throat:  -- No jugular venous distension  -- No nasal drainage, bleeding Pulmonary:  -- No crackles -- Equal breath sounds bilaterally -- Breathing non-labored at rest Cardiovascular:  -- S1, S2 present  -- No pericardial rubs  Gastrointestinal:  -- Soft, nontender, non-distended, no guarding/rebound  -- No abdominal masses appreciated, pulsatile or otherwise  Musculoskeletal / Integumentary:  -- Wounds or skin discoloration: None appreciated  -- Extremities: B/L UE and LE FROM, hands and feet warm, no edema  Neurologic:  -- Motor  function: intact and symmetric  -- Sensation: intact and symmetric   Imaging:  Colonoscopy (07/04/2018) - Diverticulosis in the sigmoid colon, in the descending colon and in the transverse colon. - The examination was otherwise normal on direct and retroflexion views.   Assessment:  57 y.o. yo Female with a problem list including...  Patient Active Problem List   Diagnosis Date Noted  . Colonic diverticular abscess   . Diverticulitis of large intestine with abscess 05/04/2018  . Hallux valgus, acquired 03/07/2018  . Mild depression ([place]) 12/29/2016  . Other insomnia 12/29/2016  . Cardiac murmur 02/10/2016  . Hypertension, benign 02/10/2016  . History of syncope 02/10/2016  . Prediabetes 02/10/2016  . Increased pressure in the eye 02/10/2016  . Vitamin D deficiency 02/10/2016  . Obesity (BMI 30.0-34.9) 02/10/2016    presents to clinic for follow-up evaluation, doing overall well s/p colonoscopy, constipation, and a recent first episode of acute sigmoid colonic diverticulitis.  Plan:   - colonoscopy results discussed  - continue adequate hydration and high-fiber diet with addition of once daily Colace stool softener +/- prn Miralax rather than frequent prn Miralax alone  - again discussed indications for and alternatives to elective partial colectomy, and patient expresses preference to defer surgery for now  - return to clinic as needed, instructed to call office if any questions or concerns  All of the above recommendations were discussed with the patient, and all of patient's questions were answered to her expressed satisfaction.  -- Marilynne Drivers Rosana Hoes, MD, [place]: [facility] for exceptional care. Office: [phone]

## 2018-07-16 ENCOUNTER — Encounter: Payer: Self-pay | Admitting: Surgery

## 2018-09-11 ENCOUNTER — Encounter: Payer: Self-pay | Admitting: Family Medicine

## 2018-10-12 ENCOUNTER — Ambulatory Visit (INDEPENDENT_AMBULATORY_CARE_PROVIDER_SITE_OTHER)
Admit: 2018-10-12 | Discharge: 2018-10-12 | Disposition: A | Discharge status: Home / Self Care | Payer: BLUE CROSS/BLUE SHIELD | Attending: Family Medicine | Admitting: Family Medicine

## 2018-10-12 ENCOUNTER — Ambulatory Visit
Admission: EM | Admit: 2018-10-12 | Discharge: 2018-10-12 | Disposition: A | Discharge status: Home / Self Care | Payer: BLUE CROSS/BLUE SHIELD | Attending: Family Medicine | Admitting: Family Medicine

## 2018-10-12 ENCOUNTER — Other Ambulatory Visit: Payer: Self-pay

## 2018-10-12 ENCOUNTER — Encounter: Payer: Self-pay | Admitting: Emergency Medicine

## 2018-10-12 DIAGNOSIS — K5792 Diverticulitis of intestine, part unspecified, without perforation or abscess without bleeding: Secondary | ICD-10-CM

## 2018-10-12 DIAGNOSIS — N200 Calculus of kidney: Secondary | ICD-10-CM | POA: Diagnosis not present

## 2018-10-12 LAB — URINALYSIS, COMPLETE (UACMP) WITH MICROSCOPIC
Bacteria, UA: NONE SEEN
Bilirubin Urine: NEGATIVE
GLUCOSE, UA: NEGATIVE mg/dL
Hgb urine dipstick: NEGATIVE
Ketones, ur: NEGATIVE mg/dL
Leukocytes, UA: NEGATIVE
PH: 5.5 (ref 5.0–8.0)
Protein, ur: NEGATIVE mg/dL
Specific Gravity, Urine: 1.025 (ref 1.005–1.030)

## 2018-10-12 MED ORDER — METRONIDAZOLE 500 MG PO TABS: 500.0000 mg | ORAL_TABLET | Freq: Three times a day (TID) | ORAL | 0 refills | Status: DC

## 2018-10-12 MED ORDER — CIPROFLOXACIN HCL 500 MG PO TABS: 500.0000 mg | ORAL_TABLET | Freq: Two times a day (BID) | ORAL | 0 refills | Status: DC

## 2018-10-12 MED ORDER — HYDROCODONE-ACETAMINOPHEN 5-325 MG PO TABS: 1.0000 | ORAL_TABLET | Freq: Three times a day (TID) | ORAL | 0 refills | Status: DC | PRN

## 2018-10-12 NOTE — ED Provider Notes (Signed)
[facility]    CSN: [id number] Arrival date & time: 10/12/18  1227  History   Chief Complaint Chief Complaint  Patient presents with  . Abdominal Pain    left  . Back Pain    left   HPI  57 year old female presents with back pain and abdominal pain.  Patient reports she has had left-sided back pain for the past week.  Worsening symptoms last night.  Worsening pain and associated nausea and vomiting.  She now has a left lower abdominal pain as well.  Moderate in severity.  Currently 6/10 in severity.  Associated chills.  No documented fever.  Has history of diverticulitis.  No known exacerbating factors.  No other associated symptoms.  No other complaints.  Past Medical History:  Diagnosis Date  . Allergy   . Depression 12/29/2016   mild  . Diverticulitis   . Dyspnea   . History of syncope   . Hypertension   . Increased pressure in the eye 02/10/2016  . Vitamin D deficiency     Patient Active Problem List   Diagnosis Date Noted  . Diverticulitis of large intestine without perforation or abscess without bleeding 05/04/2018  . Hallux valgus, acquired 03/07/2018  . Mild depression ([place]) 12/29/2016  . Other insomnia 12/29/2016  . Cardiac murmur 02/10/2016  . Hypertension, benign 02/10/2016  . History of syncope 02/10/2016  . Prediabetes 02/10/2016  . Increased pressure in the eye 02/10/2016  . Vitamin D deficiency 02/10/2016  . Obesity (BMI 30.0-34.9) 02/10/2016   Past Surgical History:  Procedure Laterality Date  . COLONOSCOPY    . COLONOSCOPY WITH PROPOFOL N/A 07/04/2018   Procedure: COLONOSCOPY WITH PROPOFOL;  Surgeon: Toledo, Benay Pike, MD;  Location: [facility] ENDOSCOPY;  Service: Gastroenterology;  Laterality: N/A;  . ganglion cyst removal Right     OB History   None    Home Medications    Prior to Admission medications   Medication Sig Start Date End Date Taking? Authorizing Provider  Cholecalciferol (VITAMIN D) 2000 units CAPS Take 1 capsule  by mouth daily.   Yes [provider]  docusate sodium (COLACE) 100 MG capsule Take 100 mg by mouth 2 (two) times daily.   Yes [provider]  Multiple Vitamin (MULTIVITAMIN) tablet Take 1 tablet by mouth daily.   Yes [provider]  ciprofloxacin (CIPRO) 500 MG tablet Take 1 tablet (500 mg total) by mouth 2 (two) times daily. 10/12/18   Coral Spikes, DO  HYDROcodone-acetaminophen (NORCO/VICODIN) 5-325 MG tablet Take 1 tablet by mouth every 8 (eight) hours as needed. 10/12/18   Coral Spikes, DO  metroNIDAZOLE (FLAGYL) 500 MG tablet Take 1 tablet (500 mg total) by mouth 3 (three) times daily. 10/12/18   Coral Spikes, DO  polyethylene glycol (MIRALAX / GLYCOLAX) packet Take 17 g by mouth as needed.    [provider]    Family History Family History  Problem Relation Age of Onset  . Diabetes Mother   . Hypothyroidism Mother   . Alcoholism Father   . Dementia Father   . Cancer Brother 32       lung cancer  . Breast cancer Maternal Aunt 80  . Breast cancer Cousin 16       mat cousin  . Colon cancer Paternal Uncle   . Pancreatic cancer Paternal Uncle     Social History Social History   Tobacco Use  . Smoking status: Never Smoker  . Smokeless tobacco: Never Used  Substance  Use Topics  . Alcohol use: Yes    Alcohol/week: 0.0 standard drinks    Comment: occasionally  . Drug use: No     Allergies   Patient has no known allergies.   Review of Systems Review of Systems  Constitutional: Positive for chills. Negative for fever.  Gastrointestinal: Positive for abdominal pain, nausea and vomiting.  Genitourinary: Negative for dysuria.  Musculoskeletal: Positive for back pain.   Physical Exam Triage Vital Signs ED Triage Vitals  Enc Vitals Group     BP 10/12/18 1240 (!) 141/81     Pulse Rate 10/12/18 1240 99     Resp 10/12/18 1240 16     Temp 10/12/18 1240 99.3 F (37.4 C)     Temp Source 10/12/18 1240 Oral     SpO2 10/12/18 1240 99  %     Weight 10/12/18 1237 170 lb (77.1 kg)     Height 10/12/18 1237 5\' 2"  (1.575 m)     Head Circumference --      Peak Flow --      Pain Score 10/12/18 1237 6     Pain Loc --      Pain Edu? --      Excl. in [place]? --   Updated Vital Signs BP (!) 141/81 (BP Location: Left Arm)   Pulse 99   Temp 99.3 F (37.4 C) (Oral)   Resp 16   Ht 5\' 2"  (1.575 m)   Wt 77.1 kg   SpO2 99%   BMI 31.09 kg/m   Visual Acuity Right Eye Distance:   Left Eye Distance:   Bilateral Distance:    Right Eye Near:   Left Eye Near:    Bilateral Near:     Physical Exam  Constitutional: She is oriented to person, place, and time. She appears well-developed. No distress.  HENT:  Head: Normocephalic and atraumatic.  Cardiovascular: Normal rate and regular rhythm.  Pulmonary/Chest: Effort normal and breath sounds normal. She has no wheezes. She has no rales.  Abdominal: Soft.  Left lower quadrant with exquisite tenderness to palpation.  Neurological: She is alert and oriented to person, place, and time.  Psychiatric: She has a normal mood and affect. Her behavior is normal.  Nursing note and vitals reviewed.  UC Treatments / Results  Labs (all labs ordered are listed, but only abnormal results are displayed) Labs Reviewed  URINALYSIS, COMPLETE (UACMP) WITH MICROSCOPIC - Abnormal; Notable for the following components:      Result Value   Color, Urine AMBER (*)    Nitrite   (*)    Value: TEST NOT REPORTED DUE TO COLOR INTERFERENCE OF URINE PIGMENT   All other components within normal limits    EKG None  Radiology Ct Abdomen Pelvis Wo Contrast  Result Date: 10/12/2018 CLINICAL DATA:  Back pain and abdominal pain EXAM: CT ABDOMEN AND PELVIS WITHOUT CONTRAST TECHNIQUE: Multidetector CT imaging of the abdomen and pelvis was performed following the standard protocol without IV contrast. COMPARISON:  Abdomen and pelvis CT 05/09/2018 FINDINGS: LOWER CHEST: There is no basilar pleural or apical  pericardial effusion. HEPATOBILIARY: The hepatic contours and density are normal. There is no intra- or extrahepatic biliary dilatation. The gallbladder is normal. PANCREAS: The pancreatic parenchymal contours are normal and there is no ductal dilatation. There is no peripancreatic fluid collection. SPLEEN: Normal. ADRENALS/URINARY TRACT: --Adrenal glands: Normal. --Right kidney/ureter: Single nonobstructing renal calculus measuring 3 mm. No hydronephrosis, perinephric stranding or solid renal mass. --Left kidney/ureter: No hydronephrosis, nephroureterolithiasis,  perinephric stranding or solid renal mass. --Urinary bladder: Normal for degree of distention STOMACH/BOWEL: --Stomach/Duodenum: Small sliding hiatal hernia. Normal duodenal course. --Small bowel: No dilatation or inflammation. --Colon: There is wall thickening and inflammatory stranding of the distal sigmoid colon. No free intraperitoneal air. No abscess or drainable fluid collection. Small amount of fluid extends into the pelvis. --Appendix: Normal. VASCULAR/LYMPHATIC: Normal course and caliber of the major abdominal vessels. No abdominal or pelvic lymphadenopathy. REPRODUCTIVE: Normal uterus and ovaries. MUSCULOSKELETAL. No bony spinal canal stenosis or focal osseous abnormality. OTHER: None. IMPRESSION: 1. Acute distal sigmoid diverticulitis. No free intraperitoneal air, abscess or drainable fluid collection. 2. Nonobstructive right nephrolithiasis. Electronically Signed   By: Ulyses Jarred M.D.   On: 10/12/2018 13:56    Procedures Procedures (including critical care time)  Medications Ordered in UC Medications - No data to display  Initial Impression / Assessment and Plan / UC Course  I have reviewed the triage vital signs and the nursing notes.  Pertinent labs & imaging results that were available during my care of the patient were reviewed by me and considered in my medical decision making (see chart for details).  Clinical Course as of  Oct 12 1558  Fri Oct 12, 2018  1310 Specific Gravity, Urine: 1.025 [AM]    Clinical Course User Index [AM] Melynda Ripple, MD   57 year old female presents with acute diverticulitis.  Treating with Cipro and Flagyl.  Vicodin for pain.  [place] controlled substance database reviewed.  No recent prescriptions for opiates.  No concerns for abuse.  Final Clinical Impressions(s) / UC Diagnoses   Final diagnoses:  Acute diverticulitis   Discharge Instructions   None    ED Prescriptions    Medication Sig Dispense Auth. Provider   ciprofloxacin (CIPRO) 500 MG tablet Take 1 tablet (500 mg total) by mouth 2 (two) times daily. 14 tablet Myeisha Kruser G, DO   metroNIDAZOLE (FLAGYL) 500 MG tablet Take 1 tablet (500 mg total) by mouth 3 (three) times daily. 21 tablet Myha Arizpe G, DO   HYDROcodone-acetaminophen (NORCO/VICODIN) 5-325 MG tablet Take 1 tablet by mouth every 8 (eight) hours as needed. 10 tablet Coral Spikes, DO     Controlled Substance Prescriptions [place] Controlled Substance Registry consulted? Not Applicable   Coral Spikes, DO 10/12/18 9450

## 2018-10-12 NOTE — ED Triage Notes (Signed)
Patient c/o left sided back pain and left sided abdominal pain that started yesterday.  Patient reports some nausea and vomiting and chills.

## 2018-10-15 NOTE — ED Notes (Signed)
Prior Authorization for CT 48016 obtained from INS. Authorization # [id number] Valid 10-12-2018.

## 2018-10-23 DIAGNOSIS — E669 Obesity, unspecified: Secondary | ICD-10-CM | POA: Diagnosis not present

## 2018-10-23 DIAGNOSIS — K5732 Diverticulitis of large intestine without perforation or abscess without bleeding: Secondary | ICD-10-CM | POA: Diagnosis not present

## 2018-10-23 DIAGNOSIS — F320 Major depressive disorder, single episode, mild: Secondary | ICD-10-CM | POA: Diagnosis not present

## 2018-10-23 DIAGNOSIS — I10 Essential (primary) hypertension: Secondary | ICD-10-CM | POA: Diagnosis not present

## 2018-11-02 DIAGNOSIS — K5720 Diverticulitis of large intestine with perforation and abscess without bleeding: Secondary | ICD-10-CM | POA: Diagnosis not present

## 2018-11-27 DIAGNOSIS — H40003 Preglaucoma, unspecified, bilateral: Secondary | ICD-10-CM | POA: Diagnosis not present

## 2019-01-23 DIAGNOSIS — K5720 Diverticulitis of large intestine with perforation and abscess without bleeding: Secondary | ICD-10-CM | POA: Diagnosis not present

## 2019-03-01 ENCOUNTER — Telehealth: Payer: Self-pay

## 2019-03-01 NOTE — Telephone Encounter (Signed)
Spoke with [facility] and let them know that patient has not been seen at [facility] in over a year. They stated they would let them know.     Copied from [place] [id number]. Topic: General - Inquiry >> Feb 27, 2019  3:09 PM Rainey Pines A wrote: Reason for CRM: PA for [facility] stated that she needs patients latest labs and clinical notes faxed to [phone].  Best contact number is [phone]

## 2019-03-22 ENCOUNTER — Other Ambulatory Visit: Payer: Self-pay | Admitting: Family Medicine

## 2019-03-22 ENCOUNTER — Encounter: Payer: Self-pay | Admitting: Family Medicine

## 2019-04-02 ENCOUNTER — Other Ambulatory Visit: Payer: Self-pay

## 2019-04-02 ENCOUNTER — Encounter: Payer: Self-pay | Admitting: Family Medicine

## 2019-04-02 ENCOUNTER — Ambulatory Visit: Payer: BLUE CROSS/BLUE SHIELD | Admitting: Family Medicine

## 2019-04-02 VITALS — BP 132/78 | HR 72 | Temp 98.4°F | Resp 12 | Wt 175.1 lb

## 2019-04-02 DIAGNOSIS — R109 Unspecified abdominal pain: Secondary | ICD-10-CM

## 2019-04-02 DIAGNOSIS — F325 Major depressive disorder, single episode, in full remission: Secondary | ICD-10-CM | POA: Diagnosis not present

## 2019-04-02 DIAGNOSIS — D473 Essential (hemorrhagic) thrombocythemia: Secondary | ICD-10-CM | POA: Diagnosis not present

## 2019-04-02 DIAGNOSIS — Z13220 Encounter for screening for lipoid disorders: Secondary | ICD-10-CM | POA: Diagnosis not present

## 2019-04-02 DIAGNOSIS — D75839 Thrombocytosis, unspecified: Secondary | ICD-10-CM | POA: Insufficient documentation

## 2019-04-02 DIAGNOSIS — R7303 Prediabetes: Secondary | ICD-10-CM

## 2019-04-02 DIAGNOSIS — Z8719 Personal history of other diseases of the digestive system: Secondary | ICD-10-CM

## 2019-04-02 MED ORDER — METRONIDAZOLE 500 MG PO TABS: 500.0000 mg | ORAL_TABLET | Freq: Two times a day (BID) | ORAL | 0 refills | Status: DC

## 2019-04-02 MED ORDER — CIPROFLOXACIN HCL 500 MG PO TABS: 500.0000 mg | ORAL_TABLET | Freq: Two times a day (BID) | ORAL | 0 refills | Status: DC

## 2019-04-02 NOTE — Progress Notes (Signed)
Name: Margaret Kramer   MRN: [id number]    DOB: 12-31-1960   Date:04/02/2019       Progress Note  Subjective  Chief Complaint  Chief Complaint  Patient presents with  . Follow-up  . Abdominal Pain    left side bowel    HPI  Left flank pain: she has a history of diverticulitis and was supposed to have partial colectomy by Dr. Jonni Sanger however it was postponed due to COVID-19. She states about 10 days ago she ate fried chicken and developed left flank pain, she had 3 doses of Flagyl at home and took medication with some improvement of symptoms, however she continues to have pain, that now radiates to left upper back, no fever or chills. Bowel movements only present when she takes Benifiber and Colace, but that has been stable since Fall of 2019 after admission for diverticulitis. No dysuria, blood in urine, no fever or chills. Denies nausea, vomiting or change in appetite.   Mild  Depression: in remission now, denies any sadness, it was combined with grieving of losing her father at the time, it took it while to feel better but is back to her baseline.   Thrombocytosis: we will repeat labs  Pre-diabetes: she denies polyphagia, polyuria or polydipsia   Patient Active Problem List   Diagnosis Date Noted  . Thrombocytosis ([place]) 04/02/2019  . Diverticulitis of large intestine without perforation or abscess without bleeding 05/04/2018  . Hallux valgus, acquired 03/07/2018  . Mild depression ([place]) 12/29/2016  . Other insomnia 12/29/2016  . Cardiac murmur 02/10/2016  . History of syncope 02/10/2016  . Prediabetes 02/10/2016  . Increased pressure in the eye 02/10/2016  . Vitamin D deficiency 02/10/2016  . Obesity (BMI 30.0-34.9) 02/10/2016    Past Surgical History:  Procedure Laterality Date  . COLONOSCOPY    . COLONOSCOPY WITH PROPOFOL N/A 07/04/2018   Procedure: COLONOSCOPY WITH PROPOFOL;  Surgeon: Toledo, Benay Pike, MD;  Location: [facility] ENDOSCOPY;  Service: Gastroenterology;   Laterality: N/A;  . ganglion cyst removal Right     Family History  Problem Relation Age of Onset  . Diabetes Mother   . Hypothyroidism Mother   . Alcoholism Father   . Dementia Father   . Cancer Brother 5       lung cancer  . Breast cancer Maternal Aunt 80  . Breast cancer Cousin 34       mat cousin  . Colon cancer Paternal Uncle   . Pancreatic cancer Paternal Uncle     Social History   Socioeconomic History  . Marital status: Single    Spouse name: Not on file  . Number of children: 0  . Years of education: Not on file  . Highest education level: Not on file  Occupational History  . Not on file  Social Needs  . Financial resource strain: Not hard at all  . Food insecurity:    Worry: Never true    Inability: Never true  . Transportation needs:    Medical: No    Non-medical: No  Tobacco Use  . Smoking status: Never Smoker  . Smokeless tobacco: Never Used  Substance and Sexual Activity  . Alcohol use: Yes    Alcohol/week: 0.0 standard drinks    Comment: occasionally  . Drug use: No  . Sexual activity: Never  Lifestyle  . Physical activity:    Days per week: 0 days    Minutes per session: 0 min  . Stress: To some extent  Relationships  . Social connections:    Talks on phone: Not on file    Gets together: Not on file    Attends religious service: Not on file    Active member of club or organization: Not on file    Attends meetings of clubs or organizations: Not on file    Relationship status: Not on file  . Intimate partner violence:    Fear of current or ex partner: No    Emotionally abused: No    Physically abused: No    Forced sexual activity: No  Other Topics Concern  . Not on file  Social History Narrative  . Not on file     Current Outpatient Medications:  .  Cholecalciferol (VITAMIN D) 2000 units CAPS, Take 1 capsule by mouth daily., Disp: , Rfl:  .  docusate sodium (COLACE) 100 MG capsule, Take 100 mg by mouth 2 (two) times daily., Disp:  , Rfl:  .  Multiple Vitamin (MULTIVITAMIN) tablet, Take 1 tablet by mouth daily., Disp: , Rfl:  .  polyethylene glycol (MIRALAX / GLYCOLAX) packet, Take 17 g by mouth as needed., Disp: , Rfl:  .  ciprofloxacin (CIPRO) 500 MG tablet, Take 1 tablet (500 mg total) by mouth 2 (two) times daily., Disp: 14 tablet, Rfl: 0 .  HYDROcodone-acetaminophen (NORCO/VICODIN) 5-325 MG tablet, Take 1 tablet by mouth every 8 (eight) hours as needed. (Patient not taking: Reported on 04/02/2019), Disp: 10 tablet, Rfl: 0 .  metroNIDAZOLE (FLAGYL) 500 MG tablet, Take 1 tablet (500 mg total) by mouth 2 (two) times daily., Disp: 14 tablet, Rfl: 0  No Known Allergies  I personally reviewed active problem list, medication list, allergies, family history, social history with the patient/caregiver today.   ROS  Ten systems reviewed and is negative except as mentioned in HPI   Objective  Vitals:   04/02/19 0814  BP: 132/78  Pulse: 72  Resp: 12  Temp: 98.4 F (36.9 C)  TempSrc: Oral  SpO2: 98%  Weight: 175 lb 1.6 oz (79.4 kg)    Body mass index is 32.03 kg/m.  Physical Exam  Constitutional: Patient appears well-developed and well-nourished. Obese  No distress.  HEENT: head atraumatic, normocephalic, pupils equal and reactive to light,  neck supple, throat within normal limits Cardiovascular: Normal rate, regular rhythm and normal heart sounds.  No murmur heard. No BLE edema. Pulmonary/Chest: Effort normal and breath sounds normal. No respiratory distress. Abdominal: Soft.  There is side tenderness, going to left flank, but negative CVA tenderness, bowel sounds increased . Psychiatric: Patient has a normal mood and affect. behavior is normal. Judgment and thought content normal.  PHQ2/9: Depression screen [facility] 2/9 04/02/2019 12/01/2017 02/28/2017 12/29/2016 02/10/2016  Decreased Interest 0 1 0 0 0  Down, Depressed, Hopeless 1 2 0 0 2  PHQ - 2 Score 1 3 0 0 2  Altered sleeping 0 1 - 3 2  Tired, decreased  energy 0 0 - 2 2  Change in appetite 0 1 - 0 0  Feeling bad or failure about yourself  0 1 - 0 0  Trouble concentrating 0 1 - 1 0  Moving slowly or fidgety/restless 0 0 - 0 0  Suicidal thoughts 0 0 - 0 0  PHQ-9 Score 1 7 - 6 6  Difficult doing work/chores Not difficult at all Not difficult at all - - -    phq 9 is negative   Fall Risk: Fall Risk  04/02/2019 12/01/2017 02/28/2017 02/10/2016  Falls in  the past year? 0 No No No  Number falls in past yr: 0 - - -  Injury with Fall? 0 - - -    Functional Status Survey: Is the patient deaf or have difficulty hearing?: No Does the patient have difficulty seeing, even when wearing glasses/contacts?: No Does the patient have difficulty concentrating, remembering, or making decisions?: No Does the patient have difficulty walking or climbing stairs?: No Does the patient have difficulty dressing or bathing?: No Does the patient have difficulty doing errands alone such as visiting a doctor's office or shopping?: No    Assessment & Plan  1. Left flank pain  Explained that if no resolution with antibiotics, we will need to at least check a kidney US if not a CT scan abdomen, we may also need to do a CT if white count is very high to rule out abscess  - COMPLETE METABOLIC PANEL WITH GFR - Sedimentation rate - C-reactive protein - CULTURE, URINE COMPREHENSIVE - Lipase - ciprofloxacin (CIPRO) 500 MG tablet; Take 1 tablet (500 mg total) by mouth 2 (two) times daily.  Dispense: 14 tablet; Refill: 0 - metroNIDAZOLE (FLAGYL) 500 MG tablet; Take 1 tablet (500 mg total) by mouth 2 (two) times daily.  Dispense: 14 tablet; Refill: 0  2. Depression, major, in remission ([place])  Doing well  3. Thrombocytosis ([facility])  - CBC with Differential/Platelet   4. Prediabetes  - Hemoglobin A1c  5. History of diverticulosis  - Sedimentation rate - C-reactive protein - ciprofloxacin (CIPRO) 500 MG tablet; Take 1 tablet (500 mg total) by mouth 2 (two) times  daily.  Dispense: 14 tablet; Refill: 0 - metroNIDAZOLE (FLAGYL) 500 MG tablet; Take 1 tablet (500 mg total) by mouth 2 (two) times daily.  Dispense: 14 tablet; Refill: 0  6. Lipid screening  - Lipid panel

## 2019-04-04 LAB — SEDIMENTATION RATE: Sed Rate: 28 mm/h (ref 0–30)

## 2019-04-04 LAB — COMPLETE METABOLIC PANEL WITH GFR
AG Ratio: 1.5 (calc) (ref 1.0–2.5)
ALT: 28 U/L (ref 6–29)
AST: 21 U/L (ref 10–35)
Albumin: 4.4 g/dL (ref 3.6–5.1)
Alkaline phosphatase (APISO): 96 U/L (ref 37–153)
BUN: 13 mg/dL (ref 7–25)
CO2: 28 mmol/L (ref 20–32)
Calcium: 9.7 mg/dL (ref 8.6–10.4)
Chloride: 103 mmol/L (ref 98–110)
Creat: 0.8 mg/dL (ref 0.50–1.05)
GFR, Est African American: 94 mL/min/{1.73_m2} (ref >=60)
GFR, Est Non African American: 81 mL/min/{1.73_m2} (ref >=60)
Globulin: 3 g/dL (calc) (ref 1.9–3.7)
Glucose, Bld: 91 mg/dL (ref 65–99)
Potassium: 4.4 mmol/L (ref 3.5–5.3)
Sodium: 139 mmol/L (ref 135–146)
Total Bilirubin: 0.7 mg/dL (ref 0.2–1.2)
Total Protein: 7.4 g/dL (ref 6.1–8.1)

## 2019-04-04 LAB — LIPID PANEL
Cholesterol: 232 mg/dL — ABNORMAL HIGH (ref <200)
HDL: 79 mg/dL (ref >=50)
LDL Cholesterol (Calc): 133 mg/dL (calc) — ABNORMAL HIGH
Non-HDL Cholesterol (Calc): 153 mg/dL (calc) — ABNORMAL HIGH (ref <130)
Total CHOL/HDL Ratio: 2.9 (calc) (ref <5.0)
Triglycerides: 97 mg/dL (ref <150)

## 2019-04-04 LAB — CBC WITH DIFFERENTIAL/PLATELET
Absolute Monocytes: 352 cells/uL (ref 200–950)
Basophils Absolute: 31 cells/uL (ref 0–200)
Basophils Relative: 0.6 %
Eosinophils Absolute: 122 cells/uL (ref 15–500)
Eosinophils Relative: 2.4 %
HCT: 37.2 % (ref 35.0–45.0)
Hemoglobin: 12.4 g/dL (ref 11.7–15.5)
Lymphs Abs: 2774 cells/uL (ref 850–3900)
MCH: 30 pg (ref 27.0–33.0)
MCHC: 33.3 g/dL (ref 32.0–36.0)
MCV: 89.9 fL (ref 80.0–100.0)
MPV: 10.3 fL (ref 7.5–12.5)
Monocytes Relative: 6.9 %
Neutro Abs: 1821 cells/uL (ref 1500–7800)
Neutrophils Relative %: 35.7 %
Platelets: 350 10*3/uL (ref 140–400)
RBC: 4.14 10*6/uL (ref 3.80–5.10)
RDW: 12.3 % (ref 11.0–15.0)
Total Lymphocyte: 54.4 %
WBC: 5.1 10*3/uL (ref 3.8–10.8)

## 2019-04-04 LAB — CULTURE, URINE COMPREHENSIVE
MICRO NUMBER:: 489550
SPECIMEN QUALITY:: ADEQUATE

## 2019-04-04 LAB — LIPASE: Lipase: 15 U/L (ref 7–60)

## 2019-04-04 LAB — HEMOGLOBIN A1C
Hgb A1c MFr Bld: 5.5 % of total Hgb (ref <5.7)
Mean Plasma Glucose: 111 (calc)
eAG (mmol/L): 6.2 (calc)

## 2019-04-04 LAB — C-REACTIVE PROTEIN: CRP: 1.7 mg/L (ref <8.0)

## 2019-04-10 IMAGING — CT CT ABD-PELV W/ CM
2 of 5 series · 15 of 46 positions shown, 17 images · IV contrast (APPLIED)
Comparison: CT abdomen and pelvis 05/04/2018.

CLINICAL DATA: Abdominal pain, fever, abscess suspected.

EXAM:
CT ABDOMEN AND PELVIS WITH CONTRAST
TECHNIQUE: Multidetector CT imaging of the abdomen and pelvis was performed
using the standard protocol following bolus administration of
intravenous contrast.
CONTRAST:  100mL G5IZRK-1MM IOPAMIDOL (G5IZRK-1MM) INJECTION 61%

[Series 2: routine abd/pel with · axial · 0.70mm/px · z∈[-429,-24]mm · 12 of 91 slices shown, 14 images]
[im 5/91  soft-tissue]
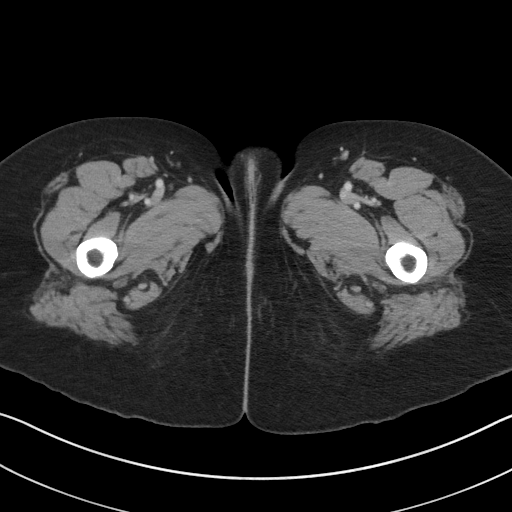
[im 5/91  bone]
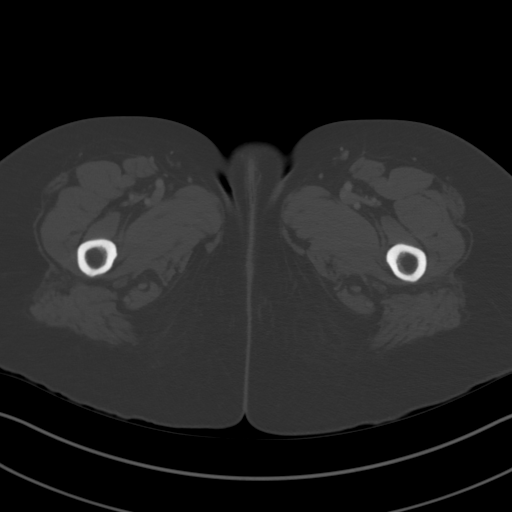
[im 15/91  soft-tissue]
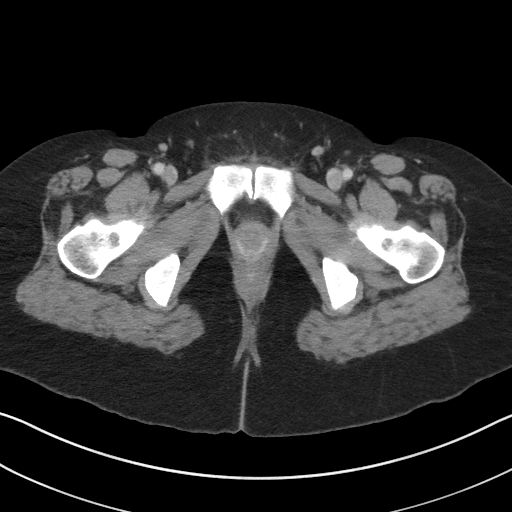
[im 19/91  soft-tissue]
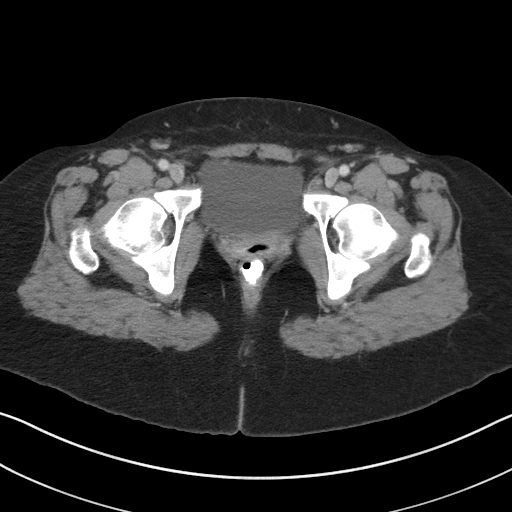
[im 29/91  soft-tissue]
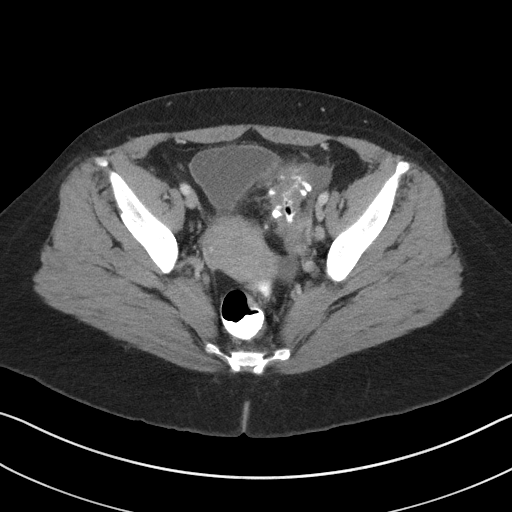
[im 34/91  soft-tissue]
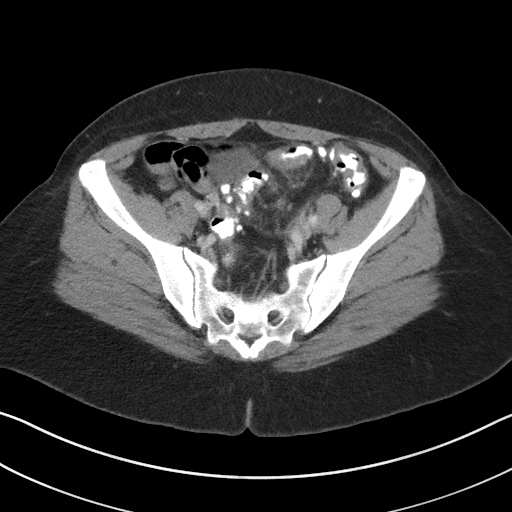
[im 43/91  soft-tissue]
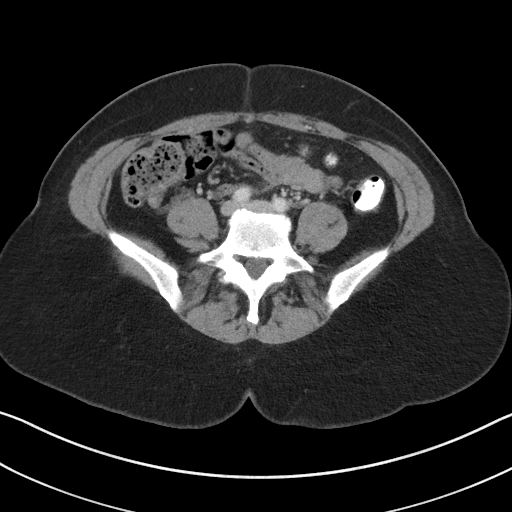
[im 48/91  soft-tissue]
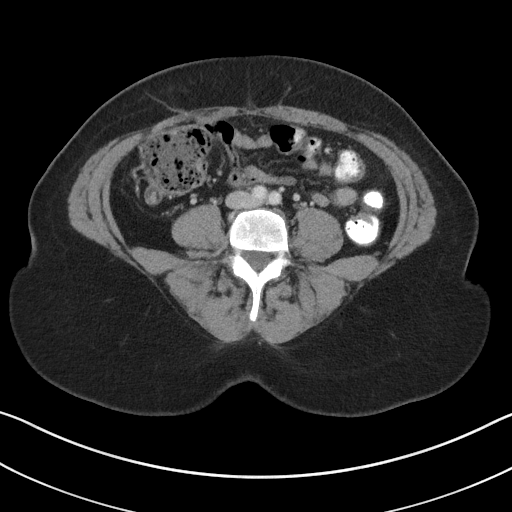
[im 57/91  soft-tissue]
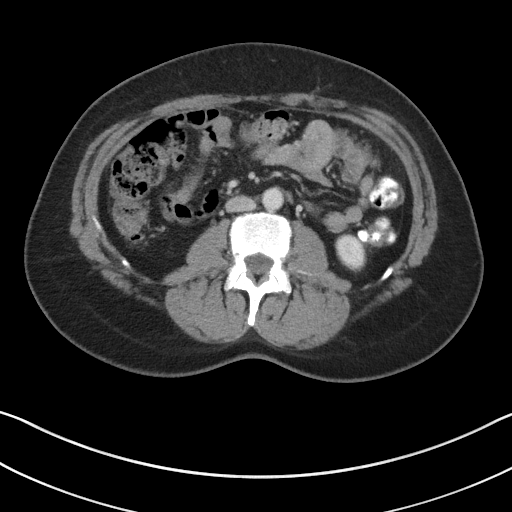
[im 62/91  soft-tissue]
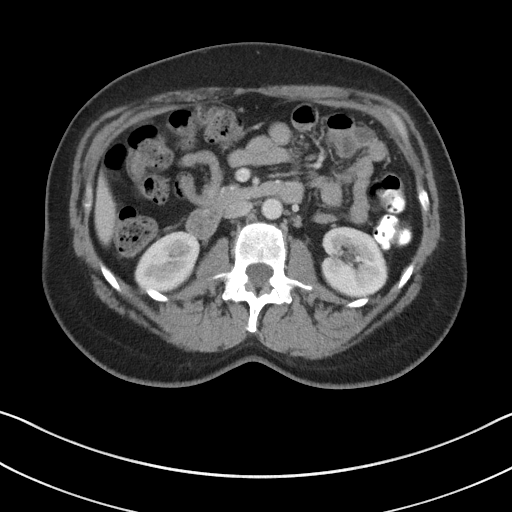
[im 62/91  bone]
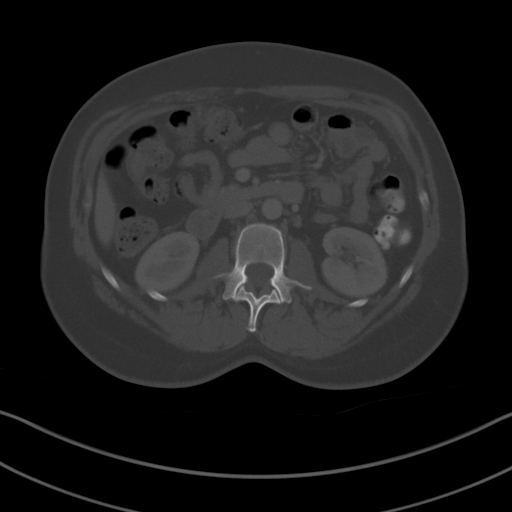
[im 72/91  soft-tissue]
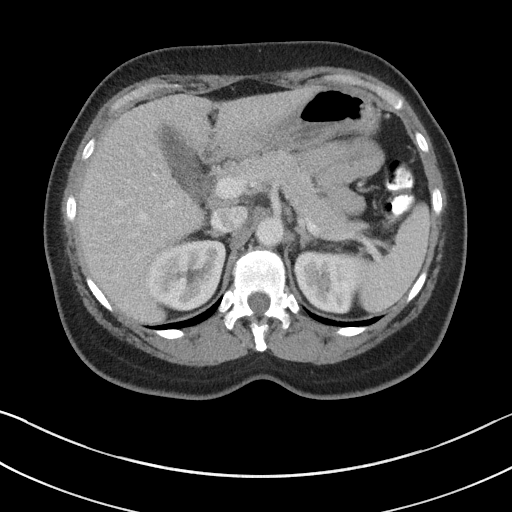
[im 76/91  soft-tissue]
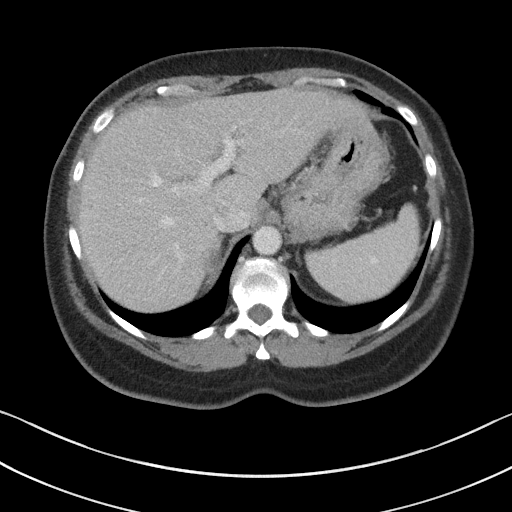
[im 86/91  soft-tissue]
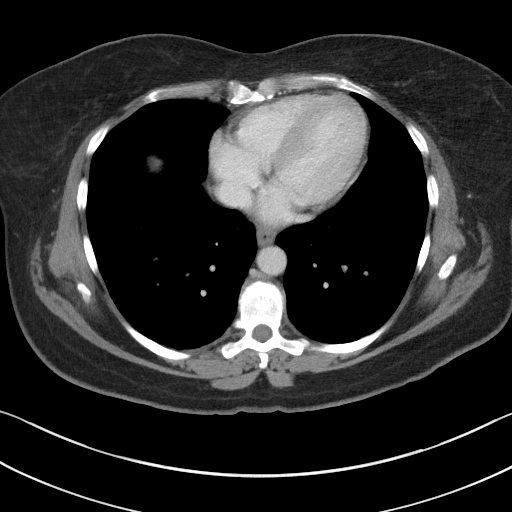

[Series 5: coronal st · coronal · 0.74mm/px · 3 of 82 slices shown]
[im 28/82  soft-tissue]
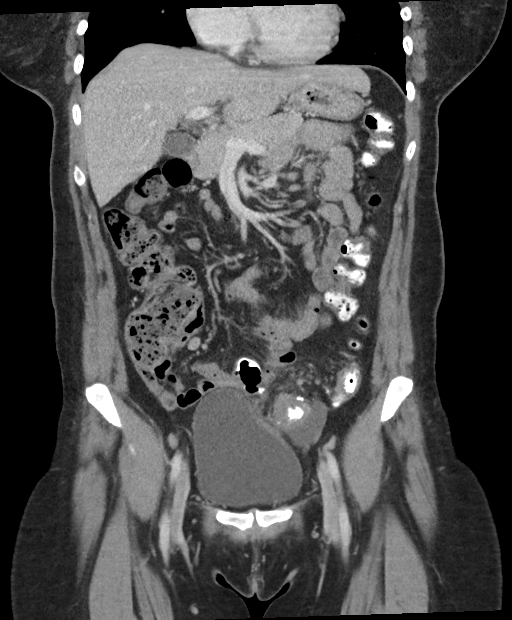
[im 37/82  soft-tissue]
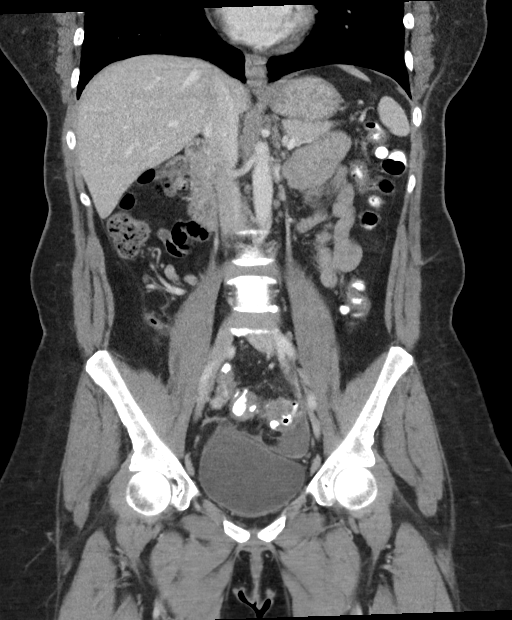
[im 46/82  soft-tissue]
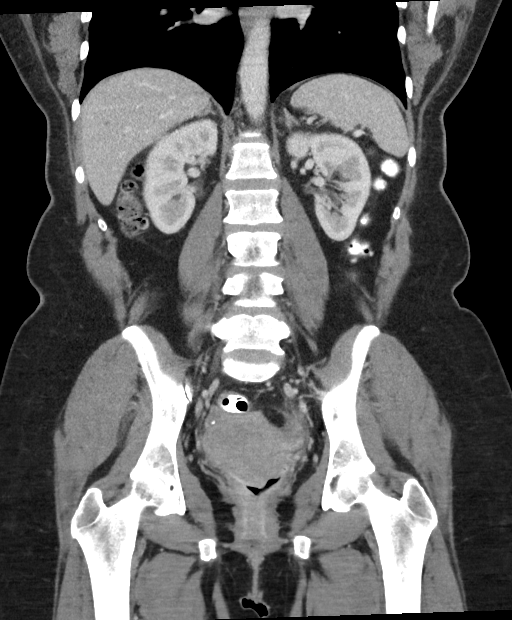

[15 of 46 positions shown; findings below may reference images not displayed]

FINDINGS: Lower chest: The lung bases are clear without focal nodule, mass, or
airspace disease. The heart size is normal. No significant pleural
or pericardial effusion is present.

Hepatobiliary: Mild fatty infiltration of the liver is again seen.
No discrete lesions are present. The common bile duct and
gallbladder are normal.

Pancreas: Unremarkable. No pancreatic ductal dilatation or
surrounding inflammatory changes.

Spleen: Normal in size without focal abnormality.

Adrenals/Urinary Tract: The adrenal glands are normal bilaterally. A
3 mm nonobstructing stone anteriorly in the right kidney is stable.
Left kidney is unremarkable. Ureters are within normal limits
bilaterally. The urinary bladder is normal.

Stomach/Bowel: The stomach and duodenum are within normal limits.
Small bowel is unremarkable. Terminal ileum is normal. Appendix is
visualized and normal. Ascending and transverse colon are within
normal limits. Diverticular changes are present in the descending
colon. Inflammatory changes are again noted about the sigmoid colon
compatible with acute diverticulitis. The fluid collection lateral
and inferior to the inflamed portion of colon has increased
slightly. Maximal dimensions are 5.6 x 3.5 x 1.3 cm. This area would
be amenable to percutaneous image guided drainage.

Vascular/Lymphatic: Subcentimeter lymph nodes are present within the
ileocolic ligament. No significant adenopathy is present.

Reproductive: A 4.7 cm fibroid at the fundus of the uterus is again
noted. Uterus and adnexa are otherwise unremarkable.

Other: No free fluid or free air is present. No significant ventral
hernia is present.

Musculoskeletal: Vertebral body heights and alignment are
maintained. No focal lytic or blastic lesions are present. Bony
pelvis is intact. Hips are located and within normal limits
bilaterally.
IMPRESSION: 1. Slight increase and fluid collection adjacent to sigmoid
diverticulitis. The collection measures 5.6 x 3.5 x 1.3 cm. This
likely represents a developing abscess.
2. Extent of inflamed bowel is not significantly changed.
3. Additional diverticular changes in the descending colon without
other inflammatory changes.
4. Stable 3 mm nonobstructing stone in the right kidney.

## 2019-05-02 ENCOUNTER — Other Ambulatory Visit: Payer: Self-pay | Admitting: Family Medicine

## 2019-05-02 DIAGNOSIS — Z1231 Encounter for screening mammogram for malignant neoplasm of breast: Secondary | ICD-10-CM

## 2019-05-09 ENCOUNTER — Other Ambulatory Visit: Payer: Self-pay

## 2019-05-09 ENCOUNTER — Ambulatory Visit
Admission: RE | Admit: 2019-05-09 | Discharge: 2019-05-09 | Disposition: A | Discharge status: Home / Self Care | Payer: BLUE CROSS/BLUE SHIELD | Source: Ambulatory Visit | Attending: Family Medicine | Admitting: Family Medicine

## 2019-05-09 DIAGNOSIS — Z1231 Encounter for screening mammogram for malignant neoplasm of breast: Secondary | ICD-10-CM | POA: Diagnosis not present

## 2019-05-28 DIAGNOSIS — H40003 Preglaucoma, unspecified, bilateral: Secondary | ICD-10-CM | POA: Diagnosis not present

## 2019-06-11 DIAGNOSIS — E119 Type 2 diabetes mellitus without complications: Secondary | ICD-10-CM | POA: Diagnosis not present

## 2019-06-11 LAB — HM DIABETES EYE EXAM

## 2019-06-12 ENCOUNTER — Encounter: Payer: Self-pay | Admitting: Family Medicine

## 2019-11-22 ENCOUNTER — Encounter: Payer: Self-pay | Admitting: Family Medicine

## 2019-11-22 ENCOUNTER — Other Ambulatory Visit: Payer: Self-pay | Admitting: Family Medicine

## 2019-11-22 ENCOUNTER — Other Ambulatory Visit: Payer: Self-pay

## 2019-11-22 ENCOUNTER — Ambulatory Visit (INDEPENDENT_AMBULATORY_CARE_PROVIDER_SITE_OTHER): Payer: BLUE CROSS/BLUE SHIELD | Admitting: Family Medicine

## 2019-11-22 VITALS — BP 147/91

## 2019-11-22 DIAGNOSIS — R7303 Prediabetes: Secondary | ICD-10-CM

## 2019-11-22 DIAGNOSIS — I10 Essential (primary) hypertension: Secondary | ICD-10-CM

## 2019-11-22 DIAGNOSIS — E559 Vitamin D deficiency, unspecified: Secondary | ICD-10-CM

## 2019-11-22 DIAGNOSIS — F325 Major depressive disorder, single episode, in full remission: Secondary | ICD-10-CM

## 2019-11-22 DIAGNOSIS — Z8719 Personal history of other diseases of the digestive system: Secondary | ICD-10-CM | POA: Insufficient documentation

## 2019-11-22 MED ORDER — HYDROCHLOROTHIAZIDE 12.5 MG PO TABS: 12.5000 mg | ORAL_TABLET | Freq: Every day | ORAL | 0 refills | Status: DC

## 2019-11-22 NOTE — Progress Notes (Signed)
Name: Margaret Kramer   MRN: [id number]    DOB: [birth year]   Date:11/22/2019       Progress Note  Subjective  Chief Complaint  Chief Complaint  Patient presents with  . Follow-up  . Hypertension    I connected with  Margaret Kramer  on 11/22/19 at  2:40 PM EST by a video enabled telemedicine application and verified that I am speaking with the correct person using two identifiers.  I discussed the limitations of evaluation and management by telemedicine and the availability of in person appointments. The patient expressed understanding and agreed to proceed. Staff also discussed with the patient that there may be a patient responsible charge related to this service. Patient Location: at home Provider Location: [facility]   HPI  Hypertension Benign: currently not taking bp medication, explained goal bp is below 140/80 , discussed staring medication . She has noticed some dull headaches earlier this week and was worried because her cousin  died of brain aneurysm in 07-Dec-2019. She states symptoms resolved  Mild  Depression: in remission now, however is concern about current event in the world. She also feels overwhelmed with long hours at work but denies symptoms of depression   Dyslipidemia:we will continue to monitor for now  The 10-year ASCVD risk score Mikey Bussing DC Brooke Bonito., et al., 2013) is: 8.7%   Values used to calculate the score:     Age: 59 years     Sex: Female     Is Non-Hispanic African American: Yes     Diabetic: No     Tobacco smoker: No     Systolic Blood Pressure: Q000111Q mmHg     Is BP treated: Yes     HDL Cholesterol: 79 mg/dL     Total Cholesterol: 232 mg/dL  Pre-diabetes: she denies polyphagia, polyuria or polydipsia , she is due for labs   Patient Active Problem List   Diagnosis Date Noted  . Depression, major, in remission ([place]) 11/22/2019  . History of diverticulitis 11/22/2019  . Hallux valgus, acquired 03/07/2018  . Mild depression  ([place]) 12/29/2016  . Other insomnia 12/29/2016  . Cardiac murmur 02/10/2016  . Hypertension, benign 02/10/2016  . History of syncope 02/10/2016  . Prediabetes 02/10/2016  . Increased pressure in the eye 02/10/2016  . Vitamin D deficiency 02/10/2016  . Obesity (BMI 30.0-34.9) 02/10/2016    Past Surgical History:  Procedure Laterality Date  . COLONOSCOPY    . COLONOSCOPY WITH PROPOFOL N/A 07/04/2018   Procedure: COLONOSCOPY WITH PROPOFOL;  Surgeon: Toledo, Benay Pike, MD;  Location: [facility] ENDOSCOPY;  Service: Gastroenterology;  Laterality: N/A;  . ganglion cyst removal Right     Family History  Problem Relation Age of Onset  . Diabetes Mother   . Hypothyroidism Mother   . Alcoholism Father   . Dementia Father   . Cancer Brother 58       lung cancer  . Breast cancer Maternal Aunt 80  . Breast cancer Cousin 7       mat cousin  . Colon cancer Paternal Uncle   . Pancreatic cancer Paternal Uncle       Current Outpatient Medications:  .  Cholecalciferol (VITAMIN D) 2000 units CAPS, Take 1 capsule by mouth daily., Disp: , Rfl:  .  Wheat Dextrin (BENEFIBER) POWD, Take by mouth., Disp: , Rfl:  .  docusate sodium (COLACE) 100 MG capsule, Take 100 mg by mouth 2 (two) times daily., Disp: , Rfl:  .  hydrochlorothiazide (HYDRODIURIL) 12.5 MG tablet, Take 1 tablet (12.5 mg total) by mouth daily., Disp: 30 tablet, Rfl: 0 .  Multiple Vitamin (MULTIVITAMIN) tablet, Take 1 tablet by mouth daily., Disp: , Rfl:  .  polyethylene glycol (MIRALAX / GLYCOLAX) packet, Take 17 g by mouth as needed., Disp: , Rfl:   No Known Allergies  I personally reviewed active problem list, medication list, allergies, family history, social history, health maintenance with the patient/caregiver today.   ROS  Ten systems reviewed and is negative except as mentioned in HPI   Objective  Virtual encounter, vital signs done at home  Vitals:   11/22/19 0820  BP: (!) 147/91    There is no height or weight on  file to calculate BMI.  Physical Exam  Awake, alert and oriented   PHQ2/9: Depression screen [facility] 2/9 11/22/2019 04/02/2019 12/01/2017 02/28/2017 12/29/2016  Decreased Interest 0 0 1 0 0  Down, Depressed, Hopeless 0 1 2 0 0  PHQ - 2 Score 0 1 3 0 0  Altered sleeping 0 0 1 - 3  Tired, decreased energy 0 0 0 - 2  Change in appetite 0 0 1 - 0  Feeling bad or failure about yourself  0 0 1 - 0  Trouble concentrating 0 0 1 - 1  Moving slowly or fidgety/restless 0 0 0 - 0  Suicidal thoughts 0 0 0 - 0  PHQ-9 Score 0 1 7 - 6  Difficult doing work/chores Not difficult at all Not difficult at all Not difficult at all - -   PHQ-2/9 Result is negative.    Fall Risk: Fall Risk  11/22/2019 04/02/2019 12/01/2017 02/28/2017 02/10/2016  Falls in the past year? 0 0 No No No  Number falls in past yr: 0 0 - - -  Injury with Fall? 0 0 - - -     Assessment & Plan   1. Depression, major, in remission [facility])  She is worried about the situation of the world and feels tired but not depressed   2. Prediabetes  Discussed diabetic diet  3. Vitamin D deficiency  Continue supplementation   4. Hypertension, benign  We will start medication, follow up in one month  - hydrochlorothiazide (HYDRODIURIL) 12.5 MG tablet; Take 1 tablet (12.5 mg total) by mouth daily.  Dispense: 30 tablet; Refill: 0  5. History of diverticulitis   I discussed the assessment and treatment plan with the patient. The patient was provided an opportunity to ask questions and all were answered. The patient agreed with the plan and demonstrated an understanding of the instructions.  The patient was advised to call back or seek an in-person evaluation if the symptoms worsen or if the condition fails to improve as anticipated.  I provided 25 minutes of non-face-to-face time during this encounter.

## 2019-12-09 DIAGNOSIS — H40053 Ocular hypertension, bilateral: Secondary | ICD-10-CM | POA: Diagnosis not present

## 2019-12-19 ENCOUNTER — Other Ambulatory Visit: Payer: Self-pay | Admitting: Family Medicine

## 2019-12-19 DIAGNOSIS — I10 Essential (primary) hypertension: Secondary | ICD-10-CM

## 2019-12-23 ENCOUNTER — Ambulatory Visit: Payer: BLUE CROSS/BLUE SHIELD | Admitting: Family Medicine

## 2019-12-24 ENCOUNTER — Encounter: Payer: Self-pay | Admitting: Family Medicine

## 2019-12-24 ENCOUNTER — Ambulatory Visit (INDEPENDENT_AMBULATORY_CARE_PROVIDER_SITE_OTHER): Payer: 59 | Admitting: Family Medicine

## 2019-12-24 ENCOUNTER — Other Ambulatory Visit: Payer: Self-pay

## 2019-12-24 VITALS — BP 148/80 | HR 84 | Temp 97.3°F | Resp 16 | Ht 62.0 in | Wt 182.3 lb

## 2019-12-24 DIAGNOSIS — R7303 Prediabetes: Secondary | ICD-10-CM | POA: Diagnosis not present

## 2019-12-24 DIAGNOSIS — E559 Vitamin D deficiency, unspecified: Secondary | ICD-10-CM | POA: Diagnosis not present

## 2019-12-24 DIAGNOSIS — F320 Major depressive disorder, single episode, mild: Secondary | ICD-10-CM

## 2019-12-24 DIAGNOSIS — F32A Depression, unspecified: Secondary | ICD-10-CM

## 2019-12-24 DIAGNOSIS — Z131 Encounter for screening for diabetes mellitus: Secondary | ICD-10-CM | POA: Diagnosis not present

## 2019-12-24 DIAGNOSIS — Z13220 Encounter for screening for lipoid disorders: Secondary | ICD-10-CM

## 2019-12-24 DIAGNOSIS — Z1159 Encounter for screening for other viral diseases: Secondary | ICD-10-CM

## 2019-12-24 DIAGNOSIS — I10 Essential (primary) hypertension: Secondary | ICD-10-CM | POA: Diagnosis not present

## 2019-12-24 NOTE — Progress Notes (Signed)
Name: Margaret Kramer   MRN: [id number]    DOB: [birth year]   Date:12/24/2019       Progress Note  Subjective  Chief Complaint  Chief Complaint  Patient presents with  . Hypertension    She has missed two doses of her medication.  . Results  . Back Pain    Continues to have left lower back pain for more than 1 month.     HPI  Hypertension Benign: her last bp one month ago was high, we gave her hctz but she has not been taking medication for the past 4 days, bp today is high again. She has noticed a catch in her chest that lasts a couple of seconds and resolves by itself. 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 She is also stressed about COVID-19. She feels like she is not taking care of herself, not going to the [facility] and exercising. She will try to find a way to take care of herself.   Dyslipidemia: we will continue to monitor for now  The 10-year ASCVD risk score Mikey Bussing DC Brooke Bonito., et al., 2013) is: 8.7%   Values used to calculate the score:     Age: 59 years     Sex: Female     Is Non-Hispanic African American: Yes     Diabetic: No     Tobacco smoker: No     Systolic Blood Pressure: Q000111Q mmHg     Is BP treated: Yes     HDL Cholesterol: 79 mg/dL     Total Cholesterol: 232 mg/dL  Pre-diabetes: she denies polyphagia, polyuria or polydipsia, we will recheck labs today    Patient Active Problem List   Diagnosis Date Noted  . Depression, major, in remission ([place]) 11/22/2019  . History of diverticulitis 11/22/2019  . Hallux valgus, acquired 03/07/2018  . Mild depression ([place]) 12/29/2016  . Other insomnia 12/29/2016  . Cardiac murmur 02/10/2016  . Hypertension, benign 02/10/2016  . History of syncope 02/10/2016  . Prediabetes 02/10/2016  . Increased  pressure in the eye 02/10/2016  . Vitamin D deficiency 02/10/2016  . Obesity (BMI 30.0-34.9) 02/10/2016    Past Surgical History:  Procedure Laterality Date  . COLONOSCOPY    . COLONOSCOPY WITH PROPOFOL N/A 07/04/2018   Procedure: COLONOSCOPY WITH PROPOFOL;  Surgeon: Toledo, Benay Pike, MD;  Location: [facility] ENDOSCOPY;  Service: Gastroenterology;  Laterality: N/A;  . ganglion cyst removal Right     Family History  Problem Relation Age of Onset  . Diabetes Mother   . Hypothyroidism Mother   . Alcoholism Father   . Dementia Father   . Cancer Brother 12       lung cancer  . Breast cancer Maternal Aunt 80  . Breast cancer Cousin 26       mat cousin  . Colon cancer Paternal Uncle   . Pancreatic cancer Paternal Uncle      Current Outpatient Medications:  .  Cholecalciferol (VITAMIN D) 2000 units CAPS, Take 1 capsule by mouth daily., Disp: , Rfl:  .  docusate sodium (COLACE) 100 MG capsule, Take 100 mg by mouth 2 (two) times daily., Disp: , Rfl:  .  hydrochlorothiazide (HYDRODIURIL) 12.5 MG tablet, TAKE 1 TABLET(12.5 MG) BY MOUTH DAILY, Disp:  90 tablet, Rfl: 0 .  Multiple Vitamin (MULTIVITAMIN) tablet, Take 1 tablet by mouth daily., Disp: , Rfl:  .  polyethylene glycol (MIRALAX / GLYCOLAX) packet, Take 17 g by mouth as needed., Disp: , Rfl:  .  Wheat Dextrin (BENEFIBER) POWD, Take by mouth., Disp: , Rfl:   No Known Allergies  I personally reviewed active problem list, medication list, allergies, family history, social history, health maintenance with the patient/caregiver today.   ROS  Constitutional: Negative for fever or weight change.  Respiratory: Negative for cough and shortness of breath.   Cardiovascular: Negative for chest pain or palpitations.  Gastrointestinal: Negative for abdominal pain, no bowel changes.  Musculoskeletal: Negative for gait problem or joint swelling.  Skin: Negative for rash.  Neurological: Negative for dizziness or headache.  No other specific  complaints in a complete review of systems (except as listed in HPI above).  Objective  Vitals:   12/24/19 1044 12/24/19 1045  BP: (!) 164/90 (!) 148/80  Pulse: 84 84  Resp: 16   Temp: (!) 97.3 F (36.3 C)   TempSrc: Temporal   SpO2: 96%   Weight: 182 lb 4.8 oz (82.7 kg)   Height: 5\' 2"  (1.575 m)     Body mass index is 33.34 kg/m.  Physical Exam  Constitutional: Patient appears well-developed and well-nourished. Obese  No distress.  HEENT: head atraumatic, normocephalic, pupils equal and reactive to light Cardiovascular: Normal rate, regular rhythm and normal heart sounds.  No murmur heard. No BLE edema. Pulmonary/Chest: Effort normal and breath sounds normal. No respiratory distress. Abdominal: Soft.  There is no tenderness. Psychiatric: Patient has a normal mood and affect. behavior is normal. Judgment and thought content normal.  PHQ2/9: Depression screen [facility] 2/9 12/24/2019 11/22/2019 04/02/2019 12/01/2017 02/28/2017  Decreased Interest 1 0 0 1 0  Down, Depressed, Hopeless 1 0 1 2 0  PHQ - 2 Score 2 0 1 3 0  Altered sleeping 2 0 0 1 -  Tired, decreased energy 1 0 0 0 -  Change in appetite 0 0 0 1 -  Feeling bad or failure about yourself  1 0 0 1 -  Trouble concentrating 1 0 0 1 -  Moving slowly or fidgety/restless 0 0 0 0 -  Suicidal thoughts 0 0 0 0 -  PHQ-9 Score 7 0 1 7 -  Difficult doing work/chores Not difficult at all Not difficult at all Not difficult at all Not difficult at all -    phq 9 is positive   Fall Risk: Fall Risk  12/24/2019 11/22/2019 04/02/2019 12/01/2017 02/28/2017  Falls in the past year? 0 0 0 No No  Number falls in past yr: 0 0 0 - -  Injury with Fall? 0 0 0 - -     Functional Status Survey: Is the patient deaf or have difficulty hearing?: No Does the patient have difficulty seeing, even when wearing glasses/contacts?: No Does the patient have difficulty concentrating, remembering, or making decisions?: No Does the patient have difficulty  walking or climbing stairs?: No Does the patient have difficulty dressing or bathing?: No Does the patient have difficulty doing errands alone such as visiting a doctor's office or shopping?: No    Assessment & Plan  1. Hypertension, benign  - COMPLETE METABOLIC PANEL WITH GFR - CBC with Differential/Platelet - Microalbumin / creatinine urine ratio - TSH - EKG 12-Lead - normal sinus rhythm   2. Mild depression ([place])  She refuses medication   3. Prediabetes  Recheck labs  4. Vitamin D deficiency  Continue supplementation   5. Lipid screening  - Lipid panel  6. Diabetes mellitus screening  - Hemoglobin A1c  7. Need for hepatitis C screening test  - Hepatitis C antibody

## 2019-12-25 LAB — COMPLETE METABOLIC PANEL WITH GFR
AG Ratio: 1.5 (calc) (ref 1.0–2.5)
ALT: 36 U/L — ABNORMAL HIGH (ref 6–29)
AST: 20 U/L (ref 10–35)
Albumin: 4.3 g/dL (ref 3.6–5.1)
Alkaline phosphatase (APISO): 106 U/L (ref 37–153)
BUN: 16 mg/dL (ref 7–25)
CO2: 28 mmol/L (ref 20–32)
Calcium: 9.6 mg/dL (ref 8.6–10.4)
Chloride: 104 mmol/L (ref 98–110)
Creat: 0.66 mg/dL (ref 0.50–1.05)
GFR, Est African American: 112 mL/min/{1.73_m2} (ref >=60)
GFR, Est Non African American: 97 mL/min/{1.73_m2} (ref >=60)
Globulin: 2.9 g/dL (calc) (ref 1.9–3.7)
Glucose, Bld: 85 mg/dL (ref 65–99)
Potassium: 4 mmol/L (ref 3.5–5.3)
Sodium: 139 mmol/L (ref 135–146)
Total Bilirubin: 0.6 mg/dL (ref 0.2–1.2)
Total Protein: 7.2 g/dL (ref 6.1–8.1)

## 2019-12-25 LAB — CBC WITH DIFFERENTIAL/PLATELET
Absolute Monocytes: 312 cells/uL (ref 200–950)
Basophils Absolute: 10 cells/uL (ref 0–200)
Basophils Relative: 0.2 %
Eosinophils Absolute: 99 cells/uL (ref 15–500)
Eosinophils Relative: 1.9 %
HCT: 37 % (ref 35.0–45.0)
Hemoglobin: 12.4 g/dL (ref 11.7–15.5)
Lymphs Abs: 2725 cells/uL (ref 850–3900)
MCH: 29.7 pg (ref 27.0–33.0)
MCHC: 33.5 g/dL (ref 32.0–36.0)
MCV: 88.7 fL (ref 80.0–100.0)
MPV: 10.3 fL (ref 7.5–12.5)
Monocytes Relative: 6 %
Neutro Abs: 2054 cells/uL (ref 1500–7800)
Neutrophils Relative %: 39.5 %
Platelets: 313 10*3/uL (ref 140–400)
RBC: 4.17 10*6/uL (ref 3.80–5.10)
RDW: 12.2 % (ref 11.0–15.0)
Total Lymphocyte: 52.4 %
WBC: 5.2 10*3/uL (ref 3.8–10.8)

## 2019-12-25 LAB — MICROALBUMIN / CREATININE URINE RATIO
Creatinine, Urine: 135 mg/dL (ref 20–275)
Microalb Creat Ratio: 3 ug/mg{creat}
Microalb, Ur: 0.4 mg/dL

## 2019-12-25 LAB — LIPID PANEL
Cholesterol: 212 mg/dL — ABNORMAL HIGH
HDL: 76 mg/dL
LDL Cholesterol (Calc): 117 mg/dL — ABNORMAL HIGH
Non-HDL Cholesterol (Calc): 136 mg/dL — ABNORMAL HIGH
Total CHOL/HDL Ratio: 2.8 (calc)
Triglycerides: 89 mg/dL

## 2019-12-25 LAB — TSH: TSH: 1.47 mIU/L (ref 0.40–4.50)

## 2019-12-25 LAB — HEPATITIS C ANTIBODY
Hepatitis C Ab: NONREACTIVE
SIGNAL TO CUT-OFF: 0.01

## 2019-12-25 LAB — HEMOGLOBIN A1C
Hgb A1c MFr Bld: 5.7 %{Hb} — ABNORMAL HIGH
Mean Plasma Glucose: 117 (calc)
eAG (mmol/L): 6.5 (calc)

## 2020-01-07 ENCOUNTER — Ambulatory Visit: Payer: 59

## 2020-01-09 ENCOUNTER — Ambulatory Visit: Payer: 59 | Admitting: Emergency Medicine

## 2020-01-09 ENCOUNTER — Other Ambulatory Visit: Payer: Self-pay

## 2020-01-09 VITALS — BP 124/80 | HR 71 | Resp 16

## 2020-01-09 DIAGNOSIS — Z0130 Encounter for examination of blood pressure without abnormal findings: Secondary | ICD-10-CM

## 2020-01-09 NOTE — Progress Notes (Signed)
Patient came in for BP check. Blood pressure read 124/80 which she stated was down from previous visit. No side effects from medication.

## 2020-03-24 ENCOUNTER — Encounter: Payer: Self-pay | Admitting: Family Medicine

## 2020-03-24 ENCOUNTER — Other Ambulatory Visit: Payer: Self-pay

## 2020-03-24 ENCOUNTER — Ambulatory Visit: Payer: 59 | Admitting: Family Medicine

## 2020-03-24 VITALS — BP 126/76 | HR 92 | Temp 97.1°F | Resp 16 | Ht 62.0 in | Wt 183.9 lb

## 2020-03-24 DIAGNOSIS — R7303 Prediabetes: Secondary | ICD-10-CM | POA: Diagnosis not present

## 2020-03-24 DIAGNOSIS — F325 Major depressive disorder, single episode, in full remission: Secondary | ICD-10-CM

## 2020-03-24 DIAGNOSIS — E785 Hyperlipidemia, unspecified: Secondary | ICD-10-CM

## 2020-03-24 DIAGNOSIS — I10 Essential (primary) hypertension: Secondary | ICD-10-CM

## 2020-03-24 DIAGNOSIS — Z23 Encounter for immunization: Secondary | ICD-10-CM

## 2020-03-24 MED ORDER — HYDROCHLOROTHIAZIDE 12.5 MG PO TABS: 12.5000 mg | ORAL_TABLET | Freq: Every day | ORAL | 1 refills | Status: DC

## 2020-03-24 NOTE — Progress Notes (Signed)
Name: Margaret Kramer   MRN: [id number]    DOB: June 17, 1961   Date:03/24/2020       Progress Note  Subjective  Chief Complaint  Chief Complaint  Patient presents with  . Depression    HPI  Hypertension Benign: she has been compliant with medication since last visit, no chest pain, palpitation or dizziness. BP is at goal today.   Mild Depression Major: she is doing much better now, she bought a bike , feeling better about herself , phq 9 has improved   Dyslipidemia: slight improvement   The 10-year ASCVD risk score [name], et al., 2013) is: 5.2%   Values used to calculate the score:     Age: 15 years     Sex: Female     Is Non-Hispanic African American: Yes     Diabetic: No     Tobacco smoker: No     Systolic Blood Pressure: 123XX123 mmHg     Is BP treated: Yes     HDL Cholesterol: 76 mg/dL     Total Cholesterol: 212 mg/dL  Pre-diabetes: she denies polyphagia, polyuria or polydipsia, last A1C was slightly up at 5.7 %  History of diverticulitis: she states she has intermittent LLQ fullness, but no fever , chills or change in bowel movement, denies change in appetite, she feels pressure before a bowel movement, no blood in stools. She had 4 episodes of diverticulitis , last year she was seen by Dr. Ronita Hipps at [facility], however she decided to cancel due to COVID-19 and was not sure about the partial colectomy. Explained risk of complications from diverticulitis.   Patient Active Problem List   Diagnosis Date Noted  . History of diverticulitis 11/22/2019  . Hallux valgus, acquired 03/07/2018  . Mild depression ([place]) 12/29/2016  . Other insomnia 12/29/2016  . Cardiac murmur 02/10/2016  . Hypertension, benign 02/10/2016  . History of syncope 02/10/2016  . Prediabetes 02/10/2016  . Increased pressure in the eye 02/10/2016  . Vitamin D deficiency 02/10/2016  . Obesity (BMI 30.0-34.9) 02/10/2016    Past Surgical History:  Procedure Laterality Date  . COLONOSCOPY    .  COLONOSCOPY WITH PROPOFOL N/A 07/04/2018   Procedure: COLONOSCOPY WITH PROPOFOL;  Surgeon: Toledo, Benay Pike, MD;  Location: [facility] ENDOSCOPY;  Service: Gastroenterology;  Laterality: N/A;  . ganglion cyst removal Right     Family History  Problem Relation Age of Onset  . Diabetes Mother   . Hypothyroidism Mother   . Alcoholism Father   . Dementia Father   . Cancer Brother 30       lung cancer  . Breast cancer Maternal Aunt 80  . Breast cancer Cousin 75       mat cousin  . Colon cancer Paternal Uncle   . Pancreatic cancer Paternal Uncle     Social History   Tobacco Use  . Smoking status: Never Smoker  . Smokeless tobacco: Never Used  Substance Use Topics  . Alcohol use: Yes    Alcohol/week: 0.0 standard drinks    Comment: occasionally     Current Outpatient Medications:  .  Cholecalciferol (VITAMIN D) 2000 units CAPS, Take 1 capsule by mouth daily., Disp: , Rfl:  .  docusate sodium (COLACE) 100 MG capsule, Take 100 mg by mouth 2 (two) times daily., Disp: , Rfl:  .  hydrochlorothiazide (HYDRODIURIL) 12.5 MG tablet, TAKE 1 TABLET(12.5 MG) BY MOUTH DAILY, Disp: 90 tablet, Rfl: 0 .  polyethylene glycol (MIRALAX / GLYCOLAX) packet, Take  17 g by mouth as needed., Disp: , Rfl:  .  Wheat Dextrin (BENEFIBER) POWD, Take by mouth., Disp: , Rfl:  .  Multiple Vitamin (MULTIVITAMIN) tablet, Take 1 tablet by mouth daily., Disp: , Rfl:   No Known Allergies  I personally reviewed active problem list, medication list, allergies, family history, social history, health maintenance with the patient/caregiver today.   ROS  Constitutional: Negative for fever or weight change.  Respiratory: Negative for cough and shortness of breath.   Cardiovascular: Negative for chest pain or palpitations.  Gastrointestinal: positive  for intermittent abdominal pain, no  bowel changes.  Musculoskeletal: Negative for gait problem or joint swelling.  Skin: Negative for rash.  Neurological: Negative for  dizziness or headache.  No other specific complaints in a complete review of systems (except as listed in HPI above).  Objective  Vitals:   03/24/20 0840  BP: 126/76  Pulse: 92  Resp: 16  Temp: (!) 97.1 F (36.2 C)  TempSrc: Temporal  SpO2: 98%  Weight: 183 lb 14.4 oz (83.4 kg)  Height: 5\' 2"  (1.575 m)    Body mass index is 33.64 kg/m.  Physical Exam  Constitutional: Patient appears well-developed and well-nourished. Obese  No distress.  HEENT: head atraumatic, normocephalic, pupils equal and reactive to light Cardiovascular: Normal rate, regular rhythm and normal heart sounds.  No murmur heard. No BLE edema. Pulmonary/Chest: Effort normal and breath sounds normal. No respiratory distress. Abdominal: Soft.  There is no tenderness.normal bowel sounds  Psychiatric: Patient has a normal mood and affect. behavior is normal. Judgment and thought content normal.  PHQ2/9: Depression screen [facility] 2/9 03/24/2020 03/24/2020 12/24/2019 11/22/2019 04/02/2019  Decreased Interest 0 0 1 0 0  Down, Depressed, Hopeless 0 0 1 0 1  PHQ - 2 Score 0 0 2 0 1  Altered sleeping 1 0 2 0 0  Tired, decreased energy 0 0 1 0 0  Change in appetite 1 0 0 0 0  Feeling bad or failure about yourself  0 0 1 0 0  Trouble concentrating 0 0 1 0 0  Moving slowly or fidgety/restless 0 0 0 0 0  Suicidal thoughts 0 0 0 0 0  PHQ-9 Score 2 0 7 0 1  Difficult doing work/chores Not difficult at all - Not difficult at all Not difficult at all Not difficult at all    phq 9 is negative   Fall Risk: Fall Risk  03/24/2020 12/24/2019 11/22/2019 04/02/2019 12/01/2017  Falls in the past year? 0 0 0 0 No  Number falls in past yr: 0 0 0 0 -  Injury with Fall? 0 0 0 0 -     Functional Status Survey: Is the patient deaf or have difficulty hearing?: No Does the patient have difficulty seeing, even when wearing glasses/contacts?: No Does the patient have difficulty concentrating, remembering, or making decisions?: No Does the  patient have difficulty walking or climbing stairs?: No Does the patient have difficulty dressing or bathing?: No Does the patient have difficulty doing errands alone such as visiting a doctor's office or shopping?: No   Assessment & Plan  1. Hypertension, benign  - hydrochlorothiazide (HYDRODIURIL) 12.5 MG tablet; Take 1 tablet (12.5 mg total) by mouth daily.  Dispense: 90 tablet; Refill: 1  2. Depression, major, in remission ([place])  Doing well at this time  3. Prediabetes  She has changed her diet since last visit   4. Dyslipidemia  Improved   5. Need for Tdap vaccination  -  Tdap vaccine greater than or equal to 7yo IM

## 2020-04-10 ENCOUNTER — Other Ambulatory Visit: Payer: Self-pay | Admitting: Family Medicine

## 2020-04-10 DIAGNOSIS — Z1231 Encounter for screening mammogram for malignant neoplasm of breast: Secondary | ICD-10-CM

## 2020-05-11 ENCOUNTER — Ambulatory Visit
Admission: RE | Admit: 2020-05-11 | Discharge: 2020-05-11 | Disposition: A | Discharge status: Home / Self Care | Payer: 59 | Source: Ambulatory Visit | Attending: Family Medicine | Admitting: Family Medicine

## 2020-05-11 ENCOUNTER — Other Ambulatory Visit: Payer: Self-pay

## 2020-05-11 DIAGNOSIS — Z1231 Encounter for screening mammogram for malignant neoplasm of breast: Secondary | ICD-10-CM | POA: Diagnosis not present

## 2020-05-14 ENCOUNTER — Other Ambulatory Visit: Payer: Self-pay

## 2020-05-14 ENCOUNTER — Ambulatory Visit (INDEPENDENT_AMBULATORY_CARE_PROVIDER_SITE_OTHER): Payer: 59 | Admitting: Family Medicine

## 2020-05-14 ENCOUNTER — Encounter: Payer: Self-pay | Admitting: Family Medicine

## 2020-05-14 ENCOUNTER — Other Ambulatory Visit (HOSPITAL_COMMUNITY)
Admission: RE | Admit: 2020-05-14 | Discharge: 2020-05-14 | Disposition: A | Discharge status: Home / Self Care | Payer: 59 | Source: Ambulatory Visit | Attending: Family Medicine | Admitting: Family Medicine

## 2020-05-14 VITALS — BP 118/78 | HR 83 | Temp 97.6°F | Resp 16 | Ht 62.0 in | Wt 182.6 lb

## 2020-05-14 DIAGNOSIS — Z0000 Encounter for general adult medical examination without abnormal findings: Secondary | ICD-10-CM

## 2020-05-14 DIAGNOSIS — Z124 Encounter for screening for malignant neoplasm of cervix: Secondary | ICD-10-CM

## 2020-05-14 DIAGNOSIS — Z23 Encounter for immunization: Secondary | ICD-10-CM | POA: Diagnosis not present

## 2020-05-14 NOTE — Progress Notes (Signed)
Name: Margaret Kramer   MRN: [id number]    DOB: [birth year]   Date:05/14/2020       Progress Note  Subjective  Chief Complaint  Chief Complaint  Patient presents with   Annual Exam    HPI  Patient presents for annual CPE.  Diet: eating more vegetables and fruit, drinking yogurt Exercise: riding her bicycle 3-4 times a week 30 minutes   USPSTF grade A and B recommendations    [facility] Visit from 05/14/2020 in [facility]  AUDIT-C Score 1     Depression: Phq 9 is  negative Depression screen [facility] 2/9 05/14/2020 03/24/2020 03/24/2020 12/24/2019 11/22/2019  Decreased Interest 0 0 0 1 0  Down, Depressed, Hopeless 0 0 0 1 0  PHQ - 2 Score 0 0 0 2 0  Altered sleeping 2 1 0 2 0  Tired, decreased energy 0 0 0 1 0  Change in appetite 0 1 0 0 0  Feeling bad or failure about yourself  0 0 0 1 0  Trouble concentrating 1 0 0 1 0  Moving slowly or fidgety/restless 0 0 0 0 0  Suicidal thoughts 0 0 0 0 0  PHQ-9 Score 3 2 0 7 0  Difficult doing work/chores Not difficult at all Not difficult at all - Not difficult at all Not difficult at all  Some recent data might be hidden   Hypertension: BP Readings from Last 3 Encounters:  05/14/20 118/78  03/24/20 126/76  01/09/20 124/80   Obesity: Wt Readings from Last 3 Encounters:  05/14/20 182 lb 9.6 oz (82.8 kg)  03/24/20 183 lb 14.4 oz (83.4 kg)  12/24/19 182 lb 4.8 oz (82.7 kg)   BMI Readings from Last 3 Encounters:  05/14/20 33.40 kg/m  03/24/20 33.64 kg/m  12/24/19 33.34 kg/m     Hep C Screening: up to date  STD testing and prevention (HIV/chl/gon/syphilis): not interested  Intimate partner violence: negative screen  Sexual History (Partners/Practices/Protection from [employer] hx STI/Pregnancy Plans): not currently sexually  Pain during Intercourse: not currently sexually active  Menstrual History/LMP/Abnormal Bleeding: discussed importance of follow up if any post-menopausal bleeding  Incontinence Symptoms: mild  stress incontinence , only when her bladder is full   Breast cancer:  - Last Mammogram: 05/11/2020  - BRCA gene screening: N/A  Osteoporosis: Discussed high calcium and vitamin D supplementation, weight bearing exercises  Cervical cancer screening: we will do it today   Skin cancer: Discussed monitoring for atypical lesions  Colorectal cancer: repeat in 2029  Lung cancer:   Low Dose CT Chest recommended if Age 67-80 years, 30 pack-year currently smoking OR have quit w/in 15years. Patient does not qualify.   ECG: 12/24/2019  Advanced Care Planning: A voluntary discussion about advance care planning including the explanation and discussion of advance directives.  Discussed health care proxy and Living will, and the patient was able to identify a health care proxy as Maya Torrain .  Patient does not have a living will at present time.  Lipids: Lab Results  Component Value Date   CHOL 212 (H) 12/24/2019   CHOL 232 (H) 04/02/2019   CHOL 199 02/23/2017   Lab Results  Component Value Date   HDL 76 12/24/2019   HDL 79 04/02/2019   HDL 86 02/23/2017   Lab Results  Component Value Date   LDLCALC 117 (H) 12/24/2019   LDLCALC 133 (H) 04/02/2019   LDLCALC 100 (H) 02/23/2017   Lab Results  Component Value Date  TRIG 89 12/24/2019   TRIG 97 04/02/2019   TRIG 63 02/23/2017   Lab Results  Component Value Date   CHOLHDL 2.8 12/24/2019   CHOLHDL 2.9 04/02/2019   CHOLHDL 2.3 02/23/2017   No results found for: LDLDIRECT  Glucose: Glucose  Date Value Ref Range Status  11/30/2012 68 65 - 99 mg/dL Final   Glucose, Bld  Date Value Ref Range Status  12/24/2019 85 65 - 99 mg/dL Final    Comment:    .            Fasting reference interval .   04/02/2019 91 65 - 99 mg/dL Final    Comment:    .            Fasting reference interval .   05/06/2018 88 65 - 99 mg/dL Final    Patient Active Problem List   Diagnosis Date Noted   History of diverticulitis 11/22/2019   Hallux  valgus, acquired 03/07/2018   Mild depression ([place]) 12/29/2016   Other insomnia 12/29/2016   Cardiac murmur 02/10/2016   Hypertension, benign 02/10/2016   History of syncope 02/10/2016   Prediabetes 02/10/2016   Increased pressure in the eye 02/10/2016   Vitamin D deficiency 02/10/2016   Obesity (BMI 30.0-34.9) 02/10/2016    Past Surgical History:  Procedure Laterality Date   COLONOSCOPY     COLONOSCOPY WITH PROPOFOL N/A 07/04/2018   Procedure: COLONOSCOPY WITH PROPOFOL;  Surgeon: Toledo, Benay Pike, MD;  Location: [facility] ENDOSCOPY;  Service: Gastroenterology;  Laterality: N/A;   ganglion cyst removal Right     Family History  Problem Relation Age of Onset   Diabetes Mother    Hypothyroidism Mother    Alcoholism Father    Dementia Father    Cancer Brother 34       lung cancer   Breast cancer Maternal Aunt 25   Breast cancer Cousin 33       mat cousin   Colon cancer Paternal Uncle    Pancreatic cancer Paternal Uncle     Social History   Socioeconomic History   Marital status: Single    Spouse name: Not on file   Number of children: 0   Years of education: Not on file   Highest education level: Not on file  Occupational History   Not on file  Tobacco Use   Smoking status: Never Smoker   Smokeless tobacco: Never Used  Vaping Use   Vaping Use: Never used  Substance and Sexual Activity   Alcohol use: Yes    Alcohol/week: 0.0 standard drinks    Comment: occasionally   Drug use: No   Sexual activity: Never  Other Topics Concern   Not on file  Social History Narrative   Not on file   Social Determinants of Health   Financial Resource Strain: Low Risk    Difficulty of Paying Living Expenses: Not hard at all  Food Insecurity: No Food Insecurity   Worried About Charity fundraiser in the Last Year: Never true   [place] in the Last Year: Never true  Transportation Needs: No Transportation Needs   Lack of Transportation  (Medical): No   Lack of Transportation (Non-Medical): No  Physical Activity: Insufficiently Active   Days of Exercise per Week: 4 days   Minutes of Exercise per Session: 30 min  Stress: No Stress Concern Present   Feeling of Stress : Not at all  Social Connections: Moderately Integrated   Frequency of Communication  with Friends and Family: More than three times a week   Frequency of Social Gatherings with Friends and Family: More than three times a week   Attends Religious Services: More than 4 times per year   Active Member of [employer] or Organizations: Yes   Attends Music therapist: More than 4 times per year   Marital Status: Never married  Human resources officer Violence: Not At Risk   Fear of Current or Ex-Partner: No   Emotionally Abused: No   Physically Abused: No   Sexually Abused: No     Current Outpatient Medications:    Cholecalciferol (VITAMIN D) 2000 units CAPS, Take 1 capsule by mouth daily., Disp: , Rfl:    docusate sodium (COLACE) 100 MG capsule, Take 100 mg by mouth 2 (two) times daily., Disp: , Rfl:    hydrochlorothiazide (HYDRODIURIL) 12.5 MG tablet, Take 1 tablet (12.5 mg total) by mouth daily., Disp: 90 tablet, Rfl: 1   Multiple Vitamin (MULTIVITAMIN) tablet, Take 1 tablet by mouth daily., Disp: , Rfl:    polyethylene glycol (MIRALAX / GLYCOLAX) packet, Take 17 g by mouth as needed., Disp: , Rfl:    Wheat Dextrin (BENEFIBER) POWD, Take by mouth., Disp: , Rfl:   No Known Allergies   ROS  Constitutional: Negative for fever or weight change.  Respiratory: Negative for cough and shortness of breath.   Cardiovascular: Negative for chest pain or palpitations.  Gastrointestinal: Negative for abdominal pain, no bowel changes.  Musculoskeletal: Negative for gait problem or joint swelling.  Skin: Negative for rash.  Neurological: Negative for dizziness or headache.  No other specific complaints in a complete review of systems (except as  listed in HPI above).  Objective  Vitals:   05/14/20 0943  BP: 118/78  Pulse: 83  Resp: 16  Temp: 97.6 F (36.4 C)  TempSrc: Temporal  SpO2: 98%  Weight: 182 lb 9.6 oz (82.8 kg)  Height: 5' 2"  (1.575 m)    Body mass index is 33.4 kg/m.  Physical Exam  Constitutional: Patient appears well-developed and well-nourished. No distress.  HENT: Head: Normocephalic and atraumatic. Ears: B TMs ok, no erythema or effusion; Nose: Not done  Mouth/Throat: not done   Eyes: Conjunctivae and EOM are normal. Pupils are equal, round, and reactive to light. No scleral icterus.  Neck: Normal range of motion. Neck supple. No JVD present. No thyromegaly present.  Cardiovascular: Normal rate, regular rhythm and normal heart sounds.  No murmur heard. No BLE edema. Pulmonary/Chest: Effort normal and breath sounds normal. No respiratory distress. Abdominal: Soft. Bowel sounds are normal, no distension. There is no tenderness. no masses Breast: no lumps or masses, no nipple discharge or rashes FEMALE GENITALIA:  External genitalia normal External urethra normal Vaginal vault normal without discharge or lesions Cervix normal without discharge or lesions Bimanual exam normal without masses RECTAL: not done  Musculoskeletal: Normal range of motion, no joint effusions. No gross deformities Neurological: he is alert and oriented to person, place, and time. No cranial nerve deficit. Coordination, balance, strength, speech and gait are normal.  Skin: Skin is warm and dry. No rash noted. No erythema.  Psychiatric: Patient has a normal mood and affect. behavior is normal. Judgment and thought content normal.   Fall Risk: Fall Risk  05/14/2020 03/24/2020 12/24/2019 11/22/2019 04/02/2019  Falls in the past year? 1 0 0 0 0  Number falls in past yr: 0 0 0 0 0  Injury with Fall? 0 0 0 0 0  Assessment & Plan  1. Well adult exam   2. Need for shingles vaccine  - Varicella-zoster vaccine IM  3. Cervical  cancer screening  - Cytology - PAP  -USPSTF grade A and B recommendations reviewed with patient; age-appropriate recommendations, preventive care, screening tests, etc discussed and encouraged; healthy living encouraged; see AVS for patient education given to patient -Discussed importance of 150 minutes of physical activity weekly, eat two servings of fish weekly, eat one serving of tree nuts ( cashews, pistachios, pecans, almonds.Marland Kitchen) every other day, eat 6 servings of fruit/vegetables daily and drink plenty of water and avoid sweet beverages.

## 2020-05-14 NOTE — Patient Instructions (Signed)

## 2020-05-15 LAB — CYTOLOGY - PAP
Comment: NEGATIVE
Diagnosis: NEGATIVE
High risk HPV: NEGATIVE

## 2020-06-04 DIAGNOSIS — H40003 Preglaucoma, unspecified, bilateral: Secondary | ICD-10-CM | POA: Diagnosis not present

## 2020-06-09 DIAGNOSIS — H40053 Ocular hypertension, bilateral: Secondary | ICD-10-CM | POA: Diagnosis not present

## 2020-08-17 ENCOUNTER — Encounter: Payer: Self-pay | Admitting: Family Medicine

## 2020-10-15 DIAGNOSIS — N898 Other specified noninflammatory disorders of vagina: Secondary | ICD-10-CM | POA: Diagnosis not present

## 2020-10-15 DIAGNOSIS — L80 Vitiligo: Secondary | ICD-10-CM | POA: Insufficient documentation

## 2020-11-09 NOTE — Progress Notes (Signed)
Name: Margaret Kramer   MRN: [id number]    DOB: 07/06/1961   Date:11/16/2020       Progress Note  Subjective  Chief Complaint  Follow up  HPI  Hypertension Benign:she denies  chest pain, palpitation or dizziness. She did not take bp medication this morning, she has not been compliant lately.   Major Depression in Remission:  she has been consciously trying not to worry about things that she cannot control. She was given anti-depressants in the past but never took the pills. She states symptoms started around 2013 when her father was sick and lasted until 2020, when she finally started to get back to her normal self. She states when depressed she gained weight because of lack of motivation    Dyslipidemia: slight improvement , she is not taking medication. On life style modification only   The 10-year ASCVD risk score Margaret Kramer., et al., 2013) is: 7.1%   Values used to calculate the score:     Age: 59 years     Sex: Female     Is Non-Hispanic African American: Yes     Diabetic: No     Tobacco smoker: No     Systolic Blood Pressure: [id number] mmHg     Is BP treated: Yes     HDL Cholesterol: 76 mg/dL     Total Cholesterol: 212 mg/dL  Pre-diabetes: she denies polyphagia, polyuria or polydipsia, last A1C was slightly up at 5.7 %, we will recheck labs   History of diverticulitis: she states she has intermittent LLQ fullness, but no fever , chills or change in bowel movement, denies change in appetite, she feels pressure before a bowel movement, no blood in stools. She had 4 episodes of diverticulitis in 2020 she has seen Margaret Kramer at [facility], however she decided to cancel due to COVID-19 and was not sure about having the  partial colectomy. She is doing well   Patient Active Problem List   Diagnosis Date Noted  . Vitiligo 10/15/2020  . History of diverticulitis 11/22/2019  . Hallux valgus, acquired 03/07/2018  . Mild depression ([place]) 12/29/2016  . Other insomnia 12/29/2016  . Cardiac  murmur 02/10/2016  . Hypertension, benign 02/10/2016  . History of syncope 02/10/2016  . Prediabetes 02/10/2016  . Increased pressure in the eye 02/10/2016  . Vitamin D deficiency 02/10/2016  . Obesity (BMI 30.0-34.9) 02/10/2016    Past Surgical History:  Procedure Laterality Date  . COLONOSCOPY    . COLONOSCOPY WITH PROPOFOL N/A 07/04/2018   Procedure: COLONOSCOPY WITH PROPOFOL;  Surgeon: Margaret Kramer;  Location: [facility] ENDOSCOPY;  Service: Gastroenterology;  Laterality: N/A;  . ganglion cyst removal Right     Family History  Problem Relation Age of Onset  . Diabetes Mother   . Hypothyroidism Mother   . Alcoholism Father   . Dementia Father   . Cancer Brother 75       lung cancer  . Breast cancer Maternal Aunt 80  . Breast cancer Cousin 47       mat cousin  . Colon cancer Paternal Uncle   . Pancreatic cancer Paternal Uncle     Social History   Tobacco Use  . Smoking status: Never Smoker  . Smokeless tobacco: Never Used  Substance Use Topics  . Alcohol use: Yes    Alcohol/week: 0.0 standard drinks    Comment: occasionally     Current Outpatient Medications:  .  Cholecalciferol (VITAMIN D) 2000 units CAPS, Take 1  capsule by mouth daily., Disp: , Rfl:  .  docusate sodium (COLACE) 100 MG capsule, Take 100 mg by mouth 2 (two) times daily., Disp: , Rfl:  .  hydrochlorothiazide (HYDRODIURIL) 12.5 MG tablet, Take 1 tablet (12.5 mg total) by mouth daily., Disp: 90 tablet, Rfl: 1 .  Multiple Vitamin (MULTIVITAMIN) tablet, Take 1 tablet by mouth daily., Disp: , Rfl:  .  polyethylene glycol (MIRALAX / GLYCOLAX) packet, Take 17 g by mouth as needed., Disp: , Rfl:  .  Wheat Dextrin (BENEFIBER) POWD, Take by mouth., Disp: , Rfl:   No Known Allergies  I personally reviewed active problem list, medication list, allergies, family history, social history, health maintenance with the patient/caregiver today.   ROS  Constitutional: Negative for fever or weight change.   Respiratory: Negative for cough and shortness of breath.   Cardiovascular: Negative for chest pain or palpitations.  Gastrointestinal: Negative for abdominal pain, no bowel changes.  Musculoskeletal: Negative for gait problem or joint swelling.  Skin: Negative for rash.  Neurological: Negative for dizziness or headache.  No other specific complaints in a complete review of systems (except as listed in HPI above).  Objective  Vitals:   11/16/20 0900  BP: 136/88  Pulse: 75  Resp: 16  Temp: 98 F (36.7 C)  TempSrc: Oral  SpO2: 97%  Weight: 188 lb 3.2 oz (85.4 kg)  Height: 5\' 2"  (1.575 m)    Body mass index is 34.42 kg/m.  Physical Exam  Constitutional: Patient appears well-developed and well-nourished. Obese  No distress.  HEENT: head atraumatic, normocephalic, pupils equal and reactive to light,  neck supple Cardiovascular: Normal rate, regular rhythm and normal heart sounds.  No murmur heard. No BLE edema. Pulmonary/Chest: Effort normal and breath sounds normal. No respiratory distress. Abdominal: Soft.  There is no tenderness. Psychiatric: Patient has a normal mood and affect. behavior is normal. Judgment and thought content normal.  PHQ2/9: Depression screen [facility] 2/9 11/16/2020 05/14/2020 03/24/2020 03/24/2020 12/24/2019  Decreased Interest 0 0 0 0 1  Down, Depressed, Hopeless 0 0 0 0 1  PHQ - 2 Score 0 0 0 0 2  Altered sleeping - 2 1 0 2  Tired, decreased energy - 0 0 0 1  Change in appetite - 0 1 0 0  Feeling bad or failure about yourself  - 0 0 0 1  Trouble concentrating - 1 0 0 1  Moving slowly or fidgety/restless - 0 0 0 0  Suicidal thoughts - 0 0 0 0  PHQ-9 Score - 3 2 0 7  Difficult doing work/chores - Not difficult at all Not difficult at all - Not difficult at all  Some recent data might be hidden    phq 9 is negative   Fall Risk: Fall Risk  11/16/2020 05/14/2020 03/24/2020 12/24/2019 11/22/2019  Falls in the past year? 1 1 0 0 0  Number falls in past yr: 1 0 0 0 0   Injury with Fall? 0 0 0 0 0  Risk for fall due to : History of fall(s) - - - -  Follow up Falls evaluation completed - - - -   Discussed fall precaution   Functional Status Survey: Is the patient deaf or have difficulty hearing?: No Does the patient have difficulty seeing, even when wearing glasses/contacts?: No Does the patient have difficulty concentrating, remembering, or making decisions?: No Does the patient have difficulty walking or climbing stairs?: No Does the patient have difficulty dressing or bathing?: No Does  the patient have difficulty doing errands alone such as visiting a doctor's office or shopping?: No    Assessment & Plan  1. Dyslipidemia  - Lipid panel  2. Hypertension, benign  - COMPLETE METABOLIC PANEL WITH GFR - hydrochlorothiazide (HYDRODIURIL) 12.5 MG tablet; Take 1 tablet (12.5 mg total) by mouth daily.  Dispense: 90 tablet; Refill: 1  3. Depression, major, in remission ([place])   4. Need for shingles vaccine  - Varicella-zoster vaccine IM (Shingrix)  5. Prediabetes  - Hemoglobin A1c  6. Vitamin D deficiency  Recheck level  7. History of diverticulitis

## 2020-11-16 ENCOUNTER — Encounter: Payer: Self-pay | Admitting: Family Medicine

## 2020-11-16 ENCOUNTER — Other Ambulatory Visit: Payer: Self-pay

## 2020-11-16 ENCOUNTER — Ambulatory Visit: Payer: 59 | Admitting: Family Medicine

## 2020-11-16 VITALS — BP 136/88 | HR 75 | Temp 98.0°F | Resp 16 | Ht 62.0 in | Wt 188.2 lb

## 2020-11-16 DIAGNOSIS — F325 Major depressive disorder, single episode, in full remission: Secondary | ICD-10-CM | POA: Diagnosis not present

## 2020-11-16 DIAGNOSIS — R7303 Prediabetes: Secondary | ICD-10-CM

## 2020-11-16 DIAGNOSIS — Z8719 Personal history of other diseases of the digestive system: Secondary | ICD-10-CM

## 2020-11-16 DIAGNOSIS — E785 Hyperlipidemia, unspecified: Secondary | ICD-10-CM | POA: Diagnosis not present

## 2020-11-16 DIAGNOSIS — Z23 Encounter for immunization: Secondary | ICD-10-CM | POA: Diagnosis not present

## 2020-11-16 DIAGNOSIS — E559 Vitamin D deficiency, unspecified: Secondary | ICD-10-CM | POA: Diagnosis not present

## 2020-11-16 DIAGNOSIS — I10 Essential (primary) hypertension: Secondary | ICD-10-CM

## 2020-11-16 MED ORDER — HYDROCHLOROTHIAZIDE 12.5 MG PO TABS: 12.5000 mg | ORAL_TABLET | Freq: Every day | ORAL | 1 refills | Status: DC

## 2020-11-17 LAB — HEMOGLOBIN A1C
Hgb A1c MFr Bld: 5.8 % of total Hgb — ABNORMAL HIGH (ref <5.7)
Mean Plasma Glucose: 120 mg/dL
eAG (mmol/L): 6.6 mmol/L

## 2020-11-17 LAB — COMPLETE METABOLIC PANEL WITH GFR
AG Ratio: 1.4 (calc) (ref 1.0–2.5)
ALT: 21 U/L (ref 6–29)
AST: 15 U/L (ref 10–35)
Albumin: 4.3 g/dL (ref 3.6–5.1)
Alkaline phosphatase (APISO): 97 U/L (ref 37–153)
BUN: 13 mg/dL (ref 7–25)
CO2: 29 mmol/L (ref 20–32)
Calcium: 9.6 mg/dL (ref 8.6–10.4)
Chloride: 104 mmol/L (ref 98–110)
Creat: 0.65 mg/dL (ref 0.50–0.99)
GFR, Est African American: 112 mL/min/{1.73_m2} (ref >=60)
GFR, Est Non African American: 96 mL/min/{1.73_m2} (ref >=60)
Globulin: 3.1 g/dL (calc) (ref 1.9–3.7)
Glucose, Bld: 98 mg/dL (ref 65–99)
Potassium: 4.2 mmol/L (ref 3.5–5.3)
Sodium: 139 mmol/L (ref 135–146)
Total Bilirubin: 0.6 mg/dL (ref 0.2–1.2)
Total Protein: 7.4 g/dL (ref 6.1–8.1)

## 2020-11-17 LAB — VITAMIN D 25 HYDROXY (VIT D DEFICIENCY, FRACTURES): Vit D, 25-Hydroxy: 30 ng/mL (ref 30–100)

## 2020-11-17 LAB — LIPID PANEL
Cholesterol: 213 mg/dL — ABNORMAL HIGH (ref <200)
HDL: 76 mg/dL (ref >=50)
LDL Cholesterol (Calc): 112 mg/dL (calc) — ABNORMAL HIGH
Non-HDL Cholesterol (Calc): 137 mg/dL (calc) — ABNORMAL HIGH (ref <130)
Total CHOL/HDL Ratio: 2.8 (calc) (ref <5.0)
Triglycerides: 132 mg/dL (ref <150)

## 2020-11-18 DIAGNOSIS — N898 Other specified noninflammatory disorders of vagina: Secondary | ICD-10-CM | POA: Diagnosis not present

## 2020-11-18 DIAGNOSIS — L900 Lichen sclerosus et atrophicus: Secondary | ICD-10-CM | POA: Diagnosis not present

## 2021-02-16 DIAGNOSIS — L80 Vitiligo: Secondary | ICD-10-CM | POA: Diagnosis not present

## 2021-02-16 DIAGNOSIS — L900 Lichen sclerosus et atrophicus: Secondary | ICD-10-CM | POA: Diagnosis not present

## 2021-04-09 ENCOUNTER — Other Ambulatory Visit: Payer: Self-pay | Admitting: Family Medicine

## 2021-04-09 DIAGNOSIS — Z1231 Encounter for screening mammogram for malignant neoplasm of breast: Secondary | ICD-10-CM

## 2021-05-12 ENCOUNTER — Other Ambulatory Visit: Payer: Self-pay

## 2021-05-12 ENCOUNTER — Ambulatory Visit
Admission: RE | Admit: 2021-05-12 | Discharge: 2021-05-12 | Disposition: A | Discharge status: Home / Self Care | Payer: 59 | Source: Ambulatory Visit | Attending: Family Medicine | Admitting: Family Medicine

## 2021-05-12 DIAGNOSIS — Z1231 Encounter for screening mammogram for malignant neoplasm of breast: Secondary | ICD-10-CM | POA: Diagnosis not present

## 2021-05-25 ENCOUNTER — Other Ambulatory Visit: Payer: Self-pay | Admitting: Family Medicine

## 2021-05-25 DIAGNOSIS — I10 Essential (primary) hypertension: Secondary | ICD-10-CM

## 2021-05-25 NOTE — Telephone Encounter (Signed)
Courtesy refill. No valid encounter with in 6 months. Called patient to scheduled appt. No answer, unable to leave message. Patient needs future visit

## 2021-05-27 ENCOUNTER — Encounter: Payer: 59 | Admitting: Unknown Physician Specialty

## 2021-06-21 ENCOUNTER — Telehealth: Payer: Self-pay | Admitting: Family Medicine

## 2021-06-21 MED ORDER — METRONIDAZOLE 500 MG PO TABS: 500.0000 mg | ORAL_TABLET | Freq: Three times a day (TID) | ORAL | 0 refills | Status: AC

## 2021-06-21 MED ORDER — CIPROFLOXACIN HCL 500 MG PO TABS: 500.0000 mg | ORAL_TABLET | Freq: Two times a day (BID) | ORAL | 0 refills | Status: DC

## 2021-06-21 NOTE — Telephone Encounter (Signed)
Patient having diverticulitis flare. Starting on Cipro and Flagyl.  [facility]

## 2021-07-12 ENCOUNTER — Other Ambulatory Visit: Payer: Self-pay | Admitting: Family Medicine

## 2021-07-12 DIAGNOSIS — I10 Essential (primary) hypertension: Secondary | ICD-10-CM

## 2021-08-09 ENCOUNTER — Encounter: Payer: Self-pay | Admitting: Family Medicine

## 2021-08-09 ENCOUNTER — Other Ambulatory Visit: Payer: Self-pay

## 2021-08-09 ENCOUNTER — Ambulatory Visit: Payer: 59 | Admitting: Family Medicine

## 2021-08-09 VITALS — BP 138/76 | HR 89 | Temp 97.9°F | Resp 16 | Ht 62.0 in | Wt 180.3 lb

## 2021-08-09 DIAGNOSIS — F325 Major depressive disorder, single episode, in full remission: Secondary | ICD-10-CM

## 2021-08-09 DIAGNOSIS — E785 Hyperlipidemia, unspecified: Secondary | ICD-10-CM | POA: Diagnosis not present

## 2021-08-09 DIAGNOSIS — E559 Vitamin D deficiency, unspecified: Secondary | ICD-10-CM | POA: Diagnosis not present

## 2021-08-09 DIAGNOSIS — I10 Essential (primary) hypertension: Secondary | ICD-10-CM

## 2021-08-09 DIAGNOSIS — R7303 Prediabetes: Secondary | ICD-10-CM

## 2021-08-09 DIAGNOSIS — Z8719 Personal history of other diseases of the digestive system: Secondary | ICD-10-CM

## 2021-08-09 MED ORDER — HYDROCHLOROTHIAZIDE 12.5 MG PO TABS: 12.5000 mg | ORAL_TABLET | Freq: Every day | ORAL | 1 refills | Status: DC

## 2021-08-09 NOTE — Progress Notes (Signed)
Name: Margaret Kramer   MRN: [id number]    DOB: Jul 20, 1961   Date:08/09/2021       Progress Note  Subjective  Chief Complaint  Follow Up  HPI  Hypertension Benign:she denies  chest pain, palpitation or dizziness. BP is at goal. No side effects of medication.   Major Depression in Remission:  she has been consciously trying not to worry about things that she cannot control. She was given anti-depressants in the past but never took the pills. She has more stress at work , short staffed but coping well with the stress.    Dyslipidemia: slight improvement , she is not taking medication. On life style modification only   The 10-year ASCVD risk score (Arnett DK, et al., 2019) is: 8%   Values used to calculate the score:     Age: 60 years     Sex: Female     Is Non-Hispanic African American: Yes     Diabetic: No     Tobacco smoker: No     Systolic Blood Pressure: 409 mmHg     Is BP treated: Yes     HDL Cholesterol: 76 mg/dL     Total Cholesterol: 213 mg/dL    Pre-diabetes: she denies polyphagia, polyuria or polydipsia , last A1C was slightly up at 5.8 %   History of diverticulitis: she states she has intermittent LLQ fullness, but no fever , chills or change in bowel movement, denies change in appetite, she feels pressure before a bowel movement, no blood in stools. She had 4 episodes of diverticulitis in 2020 she has seen Dr. Ronita Hipps at [facility], but cancelled due to COVID-19, she states she still has intermittent episodes of LLQ discomfort, but at this time no fever or chills, no change in bowel movements, she is trying to increase fiber intake and drinking more water. Only one episodes of diverticulitis in 2022 so far.    Vitamin  D deficiency: last level was back to normal range.   Osteoarthritis: she states she has noticed that her joints are aching and popping, no swelling. Discussed going to the gym to be more active . Water activity can be helpful    Patient Active Problem List    Diagnosis Date Noted   Vitiligo 10/15/2020   History of diverticulitis 11/22/2019   Hallux valgus, acquired 03/07/2018   Mild depression ([place]) 12/29/2016   Other insomnia 12/29/2016   Cardiac murmur 02/10/2016   Hypertension, benign 02/10/2016   History of syncope 02/10/2016   Prediabetes 02/10/2016   Increased pressure in the eye 02/10/2016   Vitamin D deficiency 02/10/2016   Obesity (BMI 30.0-34.9) 02/10/2016    Past Surgical History:  Procedure Laterality Date   COLONOSCOPY     COLONOSCOPY WITH PROPOFOL N/A 07/04/2018   Procedure: COLONOSCOPY WITH PROPOFOL;  Surgeon: Toledo, Benay Pike, MD;  Location: [facility] ENDOSCOPY;  Service: Gastroenterology;  Laterality: N/A;   ganglion cyst removal Right     Family History  Problem Relation Age of Onset   Diabetes Mother    Hypothyroidism Mother    Alcoholism Father    Dementia Father    Cancer Brother 82       lung cancer   Breast cancer Maternal Aunt 36   Breast cancer Cousin 55       mat cousin   Colon cancer Paternal Uncle    Pancreatic cancer Paternal Uncle     Social History   Tobacco Use   Smoking status: Never   Smokeless  tobacco: Never  Substance Use Topics   Alcohol use: Yes    Alcohol/week: 0.0 standard drinks    Comment: occasionally     Current Outpatient Medications:    Cholecalciferol (VITAMIN D) 2000 units CAPS, Take 1 capsule by mouth daily., Disp: , Rfl:    ciprofloxacin (CIPRO) 500 MG tablet, Take 1 tablet (500 mg total) by mouth 2 (two) times daily., Disp: 14 tablet, Rfl: 0   docusate sodium (COLACE) 100 MG capsule, Take 100 mg by mouth 2 (two) times daily., Disp: , Rfl:    hydrochlorothiazide (HYDRODIURIL) 12.5 MG tablet, TAKE 1 TABLET(12.5 MG) BY MOUTH DAILY, Disp: 30 tablet, Rfl: 0   Multiple Vitamin (MULTIVITAMIN) tablet, Take 1 tablet by mouth daily., Disp: , Rfl:    psyllium (METAMUCIL) 58.6 % powder, Take 1 packet by mouth 3 (three) times daily., Disp: , Rfl:    polyethylene glycol (MIRALAX /  GLYCOLAX) packet, Take 17 g by mouth as needed. (Patient not taking: Reported on 08/09/2021), Disp: , Rfl:    Wheat Dextrin (BENEFIBER) POWD, Take by mouth. (Patient not taking: Reported on 08/09/2021), Disp: , Rfl:   No Known Allergies  I personally reviewed active problem list, medication list, allergies, family history, social history, health maintenance with the patient/caregiver today.   ROS  Constitutional: Negative for fever or weight change.  Respiratory: Negative for cough and shortness of breath.   Cardiovascular: Negative for chest pain or palpitations.  Gastrointestinal: positive  for intermittent left lower abdominal pain, no bowel changes.  Musculoskeletal: Negative for gait problem or joint swelling.  Skin: Negative for rash.  Neurological: Negative for dizziness or headache.  No other specific complaints in a complete review of systems (except as listed in HPI above).   Objective  Vitals:   08/09/21 1436  BP: 138/76  Pulse: 89  Resp: 16  Temp: 97.9 F (36.6 C)  TempSrc: Oral  SpO2: 98%  Weight: 180 lb 4.8 oz (81.8 kg)  Height: 5\' 2"  (1.575 m)    Body mass index is 32.98 kg/m.  Physical Exam  Constitutional: Patient appears well-developed and well-nourished. Obese  No distress.  HEENT: head atraumatic, normocephalic, pupils equal and reactive to light, neck supple Cardiovascular: Normal rate, regular rhythm and normal heart sounds.  No murmur heard. No BLE edema. Pulmonary/Chest: Effort normal and breath sounds normal. No respiratory distress. Abdominal: Soft.  There is no tenderness. Psychiatric: Patient has a normal mood and affect. behavior is normal. Judgment and thought content normal.    PHQ2/9: Depression screen [facility] 2/9 08/09/2021 11/16/2020 05/14/2020 03/24/2020 03/24/2020  Decreased Interest 0 0 0 0 0  Down, Depressed, Hopeless 0 0 0 0 0  PHQ - 2 Score 0 0 0 0 0  Altered sleeping 1 3 2 1  0  Tired, decreased energy 1 1 0 0 0  Change in appetite 1 0  0 1 0  Feeling bad or failure about yourself  0 0 0 0 0  Trouble concentrating 0 0 1 0 0  Moving slowly or fidgety/restless 0 0 0 0 0  Suicidal thoughts 0 0 0 0 0  PHQ-9 Score 3 4 3 2  0  Difficult doing work/chores Not difficult at all Somewhat difficult Not difficult at all Not difficult at all -  Some recent data might be hidden    phq 9 is negative   Fall Risk: Fall Risk  08/09/2021 11/16/2020 05/14/2020 03/24/2020 12/24/2019  Falls in the past year? 0 1 1 0 0  Number falls  in past yr: - 1 0 0 0  Injury with Fall? - 0 0 0 0  Risk for fall due to : - History of fall(s) - - -  Follow up Falls prevention discussed Falls evaluation completed - - -      Functional Status Survey: Is the patient deaf or have difficulty hearing?: No Does the patient have difficulty seeing, even when wearing glasses/contacts?: No Does the patient have difficulty concentrating, remembering, or making decisions?: No Does the patient have difficulty walking or climbing stairs?: No Does the patient have difficulty dressing or bathing?: No Does the patient have difficulty doing errands alone such as visiting a doctor's office or shopping?: No    Assessment & Plan  1. Dyslipidemia   2. Hypertension, benign  - hydrochlorothiazide (HYDRODIURIL) 12.5 MG tablet; Take 1 tablet (12.5 mg total) by mouth daily.  Dispense: 90 tablet; Refill: 1  3. Prediabetes   4. Depression, major, in remission ([place])  Doing well   5. Vitamin D deficiency  Continue vitamin D supplementation   6. History of diverticulitis  She will consider seeing surgeon

## 2021-08-26 ENCOUNTER — Encounter: Payer: Self-pay | Admitting: Family Medicine

## 2021-08-27 ENCOUNTER — Encounter: Payer: 59 | Admitting: Family Medicine

## 2022-01-06 ENCOUNTER — Other Ambulatory Visit: Payer: Self-pay

## 2022-01-06 ENCOUNTER — Encounter: Payer: Self-pay | Admitting: Family Medicine

## 2022-01-06 ENCOUNTER — Ambulatory Visit (INDEPENDENT_AMBULATORY_CARE_PROVIDER_SITE_OTHER): Payer: 59 | Admitting: Family Medicine

## 2022-01-06 VITALS — BP 132/84 | HR 90 | Resp 16 | Ht 63.0 in | Wt 173.0 lb

## 2022-01-06 DIAGNOSIS — G8929 Other chronic pain: Secondary | ICD-10-CM | POA: Diagnosis not present

## 2022-01-06 DIAGNOSIS — M170 Bilateral primary osteoarthritis of knee: Secondary | ICD-10-CM | POA: Diagnosis not present

## 2022-01-06 DIAGNOSIS — M545 Low back pain, unspecified: Secondary | ICD-10-CM | POA: Diagnosis not present

## 2022-01-06 LAB — URINALYSIS, COMPLETE
Bacteria, UA: NONE SEEN /HPF
Bilirubin Urine: NEGATIVE
Glucose, UA: NEGATIVE
Hgb urine dipstick: NEGATIVE
Hyaline Cast: NONE SEEN /LPF
Ketones, ur: NEGATIVE
Leukocytes,Ua: NEGATIVE
Nitrite: NEGATIVE
Protein, ur: NEGATIVE
RBC / HPF: NONE SEEN /HPF (ref 0–2)
Specific Gravity, Urine: 1.012 (ref 1.001–1.035)
Squamous Epithelial / HPF: NONE SEEN /HPF (ref <=5)
WBC, UA: NONE SEEN /HPF (ref 0–5)
pH: 6 (ref 5.0–8.0)

## 2022-01-06 MED ORDER — LIDOCAINE 5 % EX PTCH: 2.0000 | MEDICATED_PATCH | CUTANEOUS | 0 refills | Status: DC

## 2022-01-06 MED ORDER — CELECOXIB 200 MG PO CAPS: 200.0000 mg | ORAL_CAPSULE | Freq: Every day | ORAL | 0 refills | Status: DC

## 2022-01-06 NOTE — Progress Notes (Signed)
Name: Margaret Kramer   MRN: [id number]    DOB: 03-06-1961   Date:01/06/2022       Progress Note  Subjective  Chief Complaint  Back Pain  HPI  Left lower back pain: she states constant pain since Summer 2022, she states hard to describe the pain, usually dull ache or tightness 1/10, however has a flare that makes pain goes up to 6/10, not sure how frequently, this flare started a couple of weeks ago.. She  has been walking daily since mid January also rides her bike outdoors when the weather is nice. No rashes on her back, no pain radiating to left lower leg, no dysuria, no hematuria or change in bowel movements.She has a history of diverticulitis, no fever or chills.    Patient Active Problem List   Diagnosis Date Noted   Vitiligo 10/15/2020   History of diverticulitis 11/22/2019   Hallux valgus, acquired 03/07/2018   Mild depression 12/29/2016   Other insomnia 12/29/2016   Cardiac murmur 02/10/2016   Hypertension, benign 02/10/2016   History of syncope 02/10/2016   Prediabetes 02/10/2016   Increased pressure in the eye 02/10/2016   Vitamin D deficiency 02/10/2016   Obesity (BMI 30.0-34.9) 02/10/2016    Past Surgical History:  Procedure Laterality Date   COLONOSCOPY     COLONOSCOPY WITH PROPOFOL N/A 07/04/2018   Procedure: COLONOSCOPY WITH PROPOFOL;  Surgeon: Toledo, Benay Pike, MD;  Location: [facility] ENDOSCOPY;  Service: Gastroenterology;  Laterality: N/A;   ganglion cyst removal Right     Family History  Problem Relation Age of Onset   Diabetes Mother    Hypothyroidism Mother    Alcoholism Father    Dementia Father    Cancer Brother 23       lung cancer   Breast cancer Maternal Aunt 46   Breast cancer Cousin 23       mat cousin   Colon cancer Paternal Uncle    Pancreatic cancer Paternal Uncle     Social History   Tobacco Use   Smoking status: Never   Smokeless tobacco: Never  Substance Use Topics   Alcohol use: Yes    Alcohol/week: 0.0 standard drinks     Comment: occasionally     Current Outpatient Medications:    Cholecalciferol (VITAMIN D) 2000 units CAPS, Take 1 capsule by mouth daily., Disp: , Rfl:    docusate sodium (COLACE) 100 MG capsule, Take 100 mg by mouth 2 (two) times daily., Disp: , Rfl:    hydrochlorothiazide (HYDRODIURIL) 12.5 MG tablet, Take 1 tablet (12.5 mg total) by mouth daily., Disp: 90 tablet, Rfl: 1   Multiple Vitamin (MULTIVITAMIN) tablet, Take 1 tablet by mouth daily., Disp: , Rfl:    psyllium (METAMUCIL) 58.6 % powder, Take 1 packet by mouth 3 (three) times daily., Disp: , Rfl:   No Known Allergies  I personally reviewed active problem list, medication list, allergies, family history, social history, health maintenance with the patient/caregiver today.   ROS  Ten systems reviewed and is negative except as mentioned in HPI   Objective  Vitals:   01/06/22 0948  BP: 132/84  Pulse: 90  Resp: 16  SpO2: 98%  Weight: 173 lb (78.5 kg)  Height: 5\' 3"  (1.6 m)    Body mass index is 30.65 kg/m.  Physical Exam  Constitutional: Patient appears well-developed and well-nourished. Obese  No distress.  HEENT: head atraumatic, normocephalic, pupils equal and reactive to light,  neck supple, Cardiovascular: Normal rate, regular rhythm and normal  heart sounds.  No murmur heard. No BLE edema. Pulmonary/Chest: Effort normal and breath sounds normal. No respiratory distress. Abdominal: Soft.  There is no tenderness. Psychiatric: Patient has a normal mood and affect. behavior is normal. Judgment and thought content normal.  Muscular skeletal: negative straight leg raise, normal rom of spine, pain during palpation of left lower back, with a knot on left side. No rashes on the area    PHQ2/9: Depression screen [facility] 2/9 01/06/2022 08/09/2021 11/16/2020 05/14/2020 03/24/2020  Decreased Interest 1 0 0 0 0  Down, Depressed, Hopeless 0 0 0 0 0  PHQ - 2 Score 1 0 0 0 0  Altered sleeping 0 1 3 2 1   Tired, decreased energy 0 1 1 0  0  Change in appetite 0 1 0 0 1  Feeling bad or failure about yourself  0 0 0 0 0  Trouble concentrating 0 0 0 1 0  Moving slowly or fidgety/restless 0 0 0 0 0  Suicidal thoughts 0 0 0 0 0  PHQ-9 Score 1 3 4 3 2   Difficult doing work/chores - Not difficult at all Somewhat difficult Not difficult at all Not difficult at all  Some recent data might be hidden    phq 9 is positive   Fall Risk: Fall Risk  01/06/2022 08/09/2021 11/16/2020 05/14/2020 03/24/2020  Falls in the past year? 0 0 1 1 0  Number falls in past yr: 0 - 1 0 0  Injury with Fall? 0 - 0 0 0  Risk for fall due to : No Fall Risks - History of fall(s) - -  Follow up Falls prevention discussed Falls prevention discussed Falls evaluation completed - -      Functional Status Survey: Is the patient deaf or have difficulty hearing?: No Does the patient have difficulty seeing, even when wearing glasses/contacts?: Yes Does the patient have difficulty concentrating, remembering, or making decisions?: No Does the patient have difficulty walking or climbing stairs?: No Does the patient have difficulty dressing or bathing?: No Does the patient have difficulty doing errands alone such as visiting a doctor's office or shopping?: No    Assessment & Plan  1. Chronic left-sided low back pain without sciatica  - lidocaine (LIDODERM) 5 %; Place 2 patches onto the skin daily. Remove & Discard patch within 12 hours or as directed by MD  Dispense: 60 patch; Refill: 0 - Urinalysis, Complete  2. Primary osteoarthritis of both knees  - lidocaine (LIDODERM) 5 %; Place 2 patches onto the skin daily. Remove & Discard patch within 12 hours or as directed by MD  Dispense: 60 patch; Refill: 0 - celecoxib (CELEBREX) 200 MG capsule; Take 1 capsule (200 mg total) by mouth daily.  Dispense: 30 capsule; Refill: 0

## 2022-02-04 NOTE — Progress Notes (Signed)
Name: Margaret Kramer   MRN: [id number]    DOB: Aug 06, 1961   Date:02/07/2022 ? ?     Progress Note ? ?Subjective ? ?Chief Complaint ? ?Follow Up ? ?HPI ? ?Hypertension Benign:she denies  chest pain, palpitation or dizziness. BP is at goal. She stopped taking HCTZ months ago, we will monitor for now. She rides a bike and is eating healthy  ? ?Major Depression in Remission:  she has been consciously trying not to worry about things that she cannot control. She was given anti-depressants in the past but never took the pills. She was eating her emotions for a while and gained weight, she states she was in a very dark place, feeling much better now  ?  ?URI: she got sick last week, exposed to her niece, most of her symptoms is hoarseness and intermittent cough but feeling better ? ?Dyslipidemia: slight improvement , she is not taking medication. On life style modification only  ? ?The 10-year ASCVD risk score (Arnett DK, et al., 2019) is: 7.4% ?  Values used to calculate the score: ?    Age: 61 years ?    Sex: Female ?    Is Non-Hispanic African American: Yes ?    Diabetic: No ?    Tobacco smoker: No ?    Systolic Blood Pressure: 419 mmHg ?    Is BP treated: Yes ?    HDL Cholesterol: 76 mg/dL ?    Total Cholesterol: 213 mg/dL  ? ?Pre-diabetes: she denies polyphagia, polyuria or polydipsia , last A1C was slightly up at 5.8 %. She exercises and also tries to eat healthy  ? ?History of diverticulitis: she states she has intermittent LLQ fullness, but no fever , chills or change in bowel movement, denies change in appetite, she feels pressure before a bowel movement, no blood in stools. She had 4 episodes of diverticulitis in 2020 she has seen Dr. Ronita Hipps at [facility], but cancelled due to COVID-19, she states she still has intermittent episodes of LLQ discomfort but not recently. Only one episodes of diverticulitis in 2022. None in 2023. Doing better   ? ?Vitamin  D deficiency: last level was back to normal range. Continue  vitamin D supplementation  ? ?Osteoarthritis: She takes Celebrex daily , no side effects and pain has improved on right knee. Marland Kitchen No swelling, redness or increase in warmth. Discussed importance of monitoring for GI symptoms . ? ?Chronic back pain with intermittent sciatica: she is doing some exercises at home and is better control with increase in physical active  ? ?Patient Active Problem List  ? Diagnosis Date Noted  ? Chronic left-sided low back pain without sciatica 01/06/2022  ? Vitiligo 10/15/2020  ? History of diverticulitis 11/22/2019  ? Hallux valgus, acquired 03/07/2018  ? Mild depression 12/29/2016  ? Other insomnia 12/29/2016  ? Cardiac murmur 02/10/2016  ? Hypertension, benign 02/10/2016  ? History of syncope 02/10/2016  ? Prediabetes 02/10/2016  ? Increased pressure in the eye 02/10/2016  ? Vitamin D deficiency 02/10/2016  ? Obesity (BMI 30.0-34.9) 02/10/2016  ? ? ?Past Surgical History:  ?Procedure Laterality Date  ? COLONOSCOPY    ? COLONOSCOPY WITH PROPOFOL N/A 07/04/2018  ? Procedure: COLONOSCOPY WITH PROPOFOL;  Surgeon: Toledo, Benay Pike, MD;  Location: [facility] ENDOSCOPY;  Service: Gastroenterology;  Laterality: N/A;  ? ganglion cyst removal Right   ? ? ?Family History  ?Problem Relation Age of Onset  ? Diabetes Mother   ? Hypothyroidism Mother   ? Alcoholism  Father   ? Dementia Father   ? Cancer Brother 27  ?     lung cancer  ? Breast cancer Maternal Aunt 80  ? Breast cancer Cousin 17  ?     mat cousin  ? Colon cancer Paternal Uncle   ? Pancreatic cancer Paternal Uncle   ? ? ?Social History  ? ?Tobacco Use  ? Smoking status: Never  ? Smokeless tobacco: Never  ?Substance Use Topics  ? Alcohol use: Yes  ?  Alcohol/week: 0.0 standard drinks  ?  Comment: occasionally  ? ? ? ?Current Outpatient Medications:  ?  celecoxib (CELEBREX) 200 MG capsule, Take 1 capsule (200 mg total) by mouth daily., Disp: 30 capsule, Rfl: 0 ?  Cholecalciferol (VITAMIN D) 2000 units CAPS, Take 1 capsule by mouth daily. (Patient  not taking: Reported on 02/07/2022), Disp: , Rfl:  ?  docusate sodium (COLACE) 100 MG capsule, Take 100 mg by mouth 2 (two) times daily. (Patient not taking: Reported on 02/07/2022), Disp: , Rfl:  ?  hydrochlorothiazide (HYDRODIURIL) 12.5 MG tablet, Take 1 tablet (12.5 mg total) by mouth daily. (Patient not taking: Reported on 02/07/2022), Disp: 90 tablet, Rfl: 1 ?  lidocaine (LIDODERM) 5 %, Place 2 patches onto the skin daily. Remove & Discard patch within 12 hours or as directed by MD (Patient not taking: Reported on 02/07/2022), Disp: 60 patch, Rfl: 0 ?  Multiple Vitamin (MULTIVITAMIN) tablet, Take 1 tablet by mouth daily. (Patient not taking: Reported on 02/07/2022), Disp: , Rfl:  ?  psyllium (METAMUCIL) 58.6 % powder, Take 1 packet by mouth 3 (three) times daily. (Patient not taking: Reported on 02/07/2022), Disp: , Rfl:  ? ?No Known Allergies ? ?I personally reviewed active problem list, medication list, allergies, family history, social history, health maintenance with the patient/caregiver today. ? ? ?ROS ? ?Constitutional: Negative for fever or significant  weight change.  ?Respiratory: Negative for cough and shortness of breath.   ?Cardiovascular: Negative for chest pain or palpitations.  ?Gastrointestinal: Negative for abdominal pain, no bowel changes.  ?Musculoskeletal: Negative for gait problem or joint swelling.  ?Skin: Negative for rash.  ?Neurological: Negative for dizziness or headache.  ?No other specific complaints in a complete review of systems (except as listed in HPI above).  ? ?Objective ? ?Vitals:  ? 02/07/22 0826  ?BP: 134/80  ?Pulse: 80  ?Resp: 16  ?SpO2: 98%  ?Weight: 176 lb (79.8 kg)  ?Height: '5\' 2"'$  (1.575 m)  ? ? ?Body mass index is 32.19 kg/m?. ? ?Physical Exam ? ?Constitutional: Patient appears well-developed and well-nourished. Obese  No distress.  ?HEENT: head atraumatic, normocephalic, pupils equal and reactive to light, neck supple, throat within normal limits ?Cardiovascular: Normal  rate, regular rhythm and normal heart sounds.  No murmur heard. No BLE edema. ?Pulmonary/Chest: Effort normal and breath sounds normal. No respiratory distress. ?Abdominal: Soft.  There is no tenderness. ?Psychiatric: Patient has a normal mood and affect. behavior is normal. Judgment and thought content normal.  ? ?Recent Results (from the past 2160 hour(s))  ?Urinalysis, Complete     Status: None  ? Collection Time: 01/06/22 10:43 AM  ?Result Value Ref Range  ? Color, Urine YELLOW YELLOW  ? APPearance CLEAR CLEAR  ? Specific Gravity, Urine 1.012 1.001 - 1.035  ? pH 6.0 5.0 - 8.0  ? Glucose, UA NEGATIVE NEGATIVE  ? Bilirubin Urine NEGATIVE NEGATIVE  ? Ketones, ur NEGATIVE NEGATIVE  ? Hgb urine dipstick NEGATIVE NEGATIVE  ? Protein, ur NEGATIVE NEGATIVE  ?  Nitrite NEGATIVE NEGATIVE  ? Leukocytes,Ua NEGATIVE NEGATIVE  ? WBC, UA NONE SEEN 0 - 5 /HPF  ? RBC / HPF NONE SEEN 0 - 2 /HPF  ? Squamous Epithelial / LPF NONE SEEN < OR = 5 /HPF  ? Bacteria, UA NONE SEEN NONE SEEN /HPF  ? Hyaline Cast NONE SEEN NONE SEEN /LPF  ? Note    ?  Comment: This urine was analyzed for the presence of WBC,  ?RBC, bacteria, casts, and other formed elements.  ?Only those elements seen were reported. ?. ?. ?  ? ? ?PHQ2/9: ? ?  02/07/2022  ?  8:26 AM 01/06/2022  ?  9:48 AM 08/09/2021  ?  2:44 PM 11/16/2020  ?  8:51 AM 05/14/2020  ?  9:52 AM  ?Depression screen PHQ 2/9  ?Decreased Interest 0 1 0 0 0  ?Down, Depressed, Hopeless 0 0 0 0 0  ?PHQ - 2 Score 0 1 0 0 0  ?Altered sleeping 3 0 '1 3 2  '$ ?Tired, decreased energy 0 0 1 1 0  ?Change in appetite 0 0 1 0 0  ?Feeling bad or failure about yourself  0 0 0 0 0  ?Trouble concentrating 0 0 0 0 1  ?Moving slowly or fidgety/restless 0 0 0 0 0  ?Suicidal thoughts 0 0 0 0 0  ?PHQ-9 Score '3 1 3 4 3  '$ ?Difficult doing work/chores   Not difficult at all Somewhat difficult Not difficult at all  ?  ?phq 9 is negative ? ? ?Fall Risk: ? ?  02/07/2022  ?  8:25 AM 01/06/2022  ?  9:48 AM 08/09/2021  ?  2:36 PM 11/16/2020  ?   8:51 AM 05/14/2020  ?  9:51 AM  ?Fall Risk   ?Falls in the past year? 0 0 0 1 1  ?Number falls in past yr: 0 0  1 0  ?Injury with Fall? 0 0  0 0  ?Risk for fall due to : No Fall Risks No Fall Risks  History of f

## 2022-02-07 ENCOUNTER — Encounter: Payer: Self-pay | Admitting: Family Medicine

## 2022-02-07 ENCOUNTER — Ambulatory Visit (INDEPENDENT_AMBULATORY_CARE_PROVIDER_SITE_OTHER): Payer: 59 | Admitting: Family Medicine

## 2022-02-07 VITALS — BP 134/80 | HR 80 | Resp 16 | Ht 62.0 in | Wt 176.0 lb

## 2022-02-07 DIAGNOSIS — J069 Acute upper respiratory infection, unspecified: Secondary | ICD-10-CM | POA: Diagnosis not present

## 2022-02-07 DIAGNOSIS — E785 Hyperlipidemia, unspecified: Secondary | ICD-10-CM | POA: Diagnosis not present

## 2022-02-07 DIAGNOSIS — I10 Essential (primary) hypertension: Secondary | ICD-10-CM | POA: Diagnosis not present

## 2022-02-07 DIAGNOSIS — M545 Low back pain, unspecified: Secondary | ICD-10-CM

## 2022-02-07 DIAGNOSIS — E559 Vitamin D deficiency, unspecified: Secondary | ICD-10-CM

## 2022-02-07 DIAGNOSIS — Z8719 Personal history of other diseases of the digestive system: Secondary | ICD-10-CM

## 2022-02-07 DIAGNOSIS — F325 Major depressive disorder, single episode, in full remission: Secondary | ICD-10-CM | POA: Diagnosis not present

## 2022-02-07 DIAGNOSIS — M170 Bilateral primary osteoarthritis of knee: Secondary | ICD-10-CM

## 2022-02-07 DIAGNOSIS — R7303 Prediabetes: Secondary | ICD-10-CM

## 2022-02-07 DIAGNOSIS — G8929 Other chronic pain: Secondary | ICD-10-CM

## 2022-02-07 MED ORDER — BENZONATATE 100 MG PO CAPS: 100.0000 mg | ORAL_CAPSULE | Freq: Two times a day (BID) | ORAL | 0 refills | Status: DC | PRN

## 2022-02-07 MED ORDER — CELECOXIB 200 MG PO CAPS: 200.0000 mg | ORAL_CAPSULE | Freq: Every day | ORAL | 1 refills | Status: DC

## 2022-02-08 LAB — COMPLETE METABOLIC PANEL WITH GFR
AG Ratio: 1.4 (calc) (ref 1.0–2.5)
ALT: 43 U/L — ABNORMAL HIGH (ref 6–29)
AST: 25 U/L (ref 10–35)
Albumin: 4.3 g/dL (ref 3.6–5.1)
Alkaline phosphatase (APISO): 128 U/L (ref 37–153)
BUN: 19 mg/dL (ref 7–25)
CO2: 27 mmol/L (ref 20–32)
Calcium: 9.6 mg/dL (ref 8.6–10.4)
Chloride: 104 mmol/L (ref 98–110)
Creat: 0.69 mg/dL (ref 0.50–1.05)
Globulin: 3 g/dL (calc) (ref 1.9–3.7)
Glucose, Bld: 96 mg/dL (ref 65–99)
Potassium: 4.5 mmol/L (ref 3.5–5.3)
Sodium: 139 mmol/L (ref 135–146)
Total Bilirubin: 0.4 mg/dL (ref 0.2–1.2)
Total Protein: 7.3 g/dL (ref 6.1–8.1)
eGFR: 99 mL/min/{1.73_m2} (ref >=60)

## 2022-02-08 LAB — VITAMIN D 25 HYDROXY (VIT D DEFICIENCY, FRACTURES): Vit D, 25-Hydroxy: 32 ng/mL (ref 30–100)

## 2022-02-08 LAB — LIPID PANEL
Cholesterol: 203 mg/dL — ABNORMAL HIGH (ref <200)
HDL: 77 mg/dL (ref >=50)
LDL Cholesterol (Calc): 111 mg/dL (calc) — ABNORMAL HIGH
Non-HDL Cholesterol (Calc): 126 mg/dL (calc) (ref <130)
Total CHOL/HDL Ratio: 2.6 (calc) (ref <5.0)
Triglycerides: 66 mg/dL (ref <150)

## 2022-02-08 LAB — CBC WITH DIFFERENTIAL/PLATELET
Absolute Monocytes: 421 cells/uL (ref 200–950)
Basophils Absolute: 41 cells/uL (ref 0–200)
Basophils Relative: 0.6 %
Eosinophils Absolute: 269 cells/uL (ref 15–500)
Eosinophils Relative: 3.9 %
HCT: 37.4 % (ref 35.0–45.0)
Hemoglobin: 12.1 g/dL (ref 11.7–15.5)
Lymphs Abs: 3402 cells/uL (ref 850–3900)
MCH: 29.4 pg (ref 27.0–33.0)
MCHC: 32.4 g/dL (ref 32.0–36.0)
MCV: 91 fL (ref 80.0–100.0)
MPV: 10.6 fL (ref 7.5–12.5)
Monocytes Relative: 6.1 %
Neutro Abs: 2767 cells/uL (ref 1500–7800)
Neutrophils Relative %: 40.1 %
Platelets: 309 10*3/uL (ref 140–400)
RBC: 4.11 10*6/uL (ref 3.80–5.10)
RDW: 12.3 % (ref 11.0–15.0)
Total Lymphocyte: 49.3 %
WBC: 6.9 10*3/uL (ref 3.8–10.8)

## 2022-02-08 LAB — HEMOGLOBIN A1C
Hgb A1c MFr Bld: 5.5 % of total Hgb (ref <5.7)
Mean Plasma Glucose: 111 mg/dL
eAG (mmol/L): 6.2 mmol/L

## 2022-05-23 DIAGNOSIS — H2513 Age-related nuclear cataract, bilateral: Secondary | ICD-10-CM | POA: Diagnosis not present

## 2022-05-23 DIAGNOSIS — E119 Type 2 diabetes mellitus without complications: Secondary | ICD-10-CM | POA: Diagnosis not present

## 2022-05-23 DIAGNOSIS — H40053 Ocular hypertension, bilateral: Secondary | ICD-10-CM | POA: Diagnosis not present

## 2022-05-23 LAB — HM DIABETES EYE EXAM

## 2022-06-13 ENCOUNTER — Other Ambulatory Visit: Payer: Self-pay | Admitting: Family Medicine

## 2022-06-13 DIAGNOSIS — Z1231 Encounter for screening mammogram for malignant neoplasm of breast: Secondary | ICD-10-CM

## 2022-06-23 ENCOUNTER — Ambulatory Visit
Admission: RE | Admit: 2022-06-23 | Discharge: 2022-06-23 | Disposition: A | Discharge status: Home / Self Care | Payer: 59 | Source: Ambulatory Visit | Attending: Family Medicine | Admitting: Family Medicine

## 2022-06-23 DIAGNOSIS — Z1231 Encounter for screening mammogram for malignant neoplasm of breast: Secondary | ICD-10-CM | POA: Insufficient documentation

## 2022-07-22 ENCOUNTER — Ambulatory Visit
Admission: EM | Admit: 2022-07-22 | Discharge: 2022-07-22 | Disposition: A | Discharge status: Home / Self Care | Payer: 59 | Attending: Emergency Medicine | Admitting: Emergency Medicine

## 2022-07-22 ENCOUNTER — Encounter: Payer: Self-pay | Admitting: Emergency Medicine

## 2022-07-22 DIAGNOSIS — H6982 Other specified disorders of Eustachian tube, left ear: Secondary | ICD-10-CM

## 2022-07-22 MED ORDER — FLUTICASONE PROPIONATE 50 MCG/ACT NA SUSP: 2.0000 | Freq: Every day | NASAL | 1 refills | Status: DC

## 2022-07-22 NOTE — Discharge Instructions (Signed)
Take over-the-counter Zyrtec, Claritin, or Allegra once daily to help with allergic symptoms.  Instill 2 squirts of fluticasone in each nostril at bedtime nightly.  And the nasal away from the septum of your nose and follow each set of squirts with 1 squirt of nasal saline to push the particles up into your turbinates where they will take effect.  Continue to equalize your ears as shown to help clear mucus from eustachian tubes and maintain patency.

## 2022-07-22 NOTE — ED Triage Notes (Signed)
Patient c/o fullness and discomfort in her left ear for about a month.  Patient denies fevers.

## 2022-07-22 NOTE — ED Provider Notes (Signed)
[facility]    CSN: [id number] Arrival date & time: 07/22/22  1257      History   Chief Complaint Chief Complaint  Patient presents with   Otalgia    HPI Margaret Kramer is a 61 y.o. female.   HPI  61 year old female here for evaluation of left ear fullness.  Patient reports that she has been experiencing fullness in her left ear for the past month.  She states that when she turns her head to the left side she feels like fluid is going to run out but it will not.  She has also been trying to equalize her ear but has not been able to.  She does endorse some mild postnasal drip and a nonproductive cough but denies any runny nose, nasal congestion, ringing in ears, dizziness, shortness of breath, or wheezing.  Past Medical History:  Diagnosis Date   Allergy    Depression 12/29/2016   mild   Diverticulitis    Dyspnea    History of syncope    Hypertension    Increased pressure in the eye 02/10/2016   Vitamin D deficiency     Patient Active Problem List   Diagnosis Date Noted   Depression, major, in remission ([place]) 02/07/2022   Chronic left-sided low back pain without sciatica 01/06/2022   Vitiligo 10/15/2020   History of diverticulitis 11/22/2019   Hallux valgus, acquired 03/07/2018   Other insomnia 12/29/2016   Cardiac murmur 02/10/2016   Hypertension, benign 02/10/2016   History of syncope 02/10/2016   Prediabetes 02/10/2016   Increased pressure in the eye 02/10/2016   Vitamin D deficiency 02/10/2016   Obesity (BMI 30.0-34.9) 02/10/2016    Past Surgical History:  Procedure Laterality Date   COLONOSCOPY     COLONOSCOPY WITH PROPOFOL N/A 07/04/2018   Procedure: COLONOSCOPY WITH PROPOFOL;  Surgeon: Toledo, Benay Pike, MD;  Location: [facility] ENDOSCOPY;  Service: Gastroenterology;  Laterality: N/A;   ganglion cyst removal Right     OB History   No obstetric history on file.      Home Medications    Prior to Admission medications   Medication  Sig Start Date End Date Taking? Authorizing Provider  celecoxib (CELEBREX) 200 MG capsule Take 1 capsule (200 mg total) by mouth daily. 02/07/22  Yes Sowles, Drue Stager, MD  fluticasone (FLONASE) 50 MCG/ACT nasal spray Place 2 sprays into both nostrils daily. 07/22/22  Yes Margarette Canada, NP  Multiple Vitamin (MULTIVITAMIN) tablet Take 1 tablet by mouth daily.   Yes [provider]    Family History Family History  Problem Relation Age of Onset   Diabetes Mother    Hypothyroidism Mother    Alcoholism Father    Dementia Father    Cancer Brother 46       lung cancer   Breast cancer Maternal Aunt 62   Breast cancer Cousin 12       mat cousin   Colon cancer Paternal Uncle    Pancreatic cancer Paternal Uncle     Social History Social History   Tobacco Use   Smoking status: Never   Smokeless tobacco: Never  Vaping Use   Vaping Use: Never used  Substance Use Topics   Alcohol use: Yes    Alcohol/week: 0.0 standard drinks of alcohol    Comment: occasionally   Drug use: No     Allergies   Patient has no known allergies.   Review of Systems Review of Systems  HENT:  Positive for ear pain  and postnasal drip. Negative for congestion, ear discharge, hearing loss, rhinorrhea and tinnitus.   Respiratory:  Positive for cough. Negative for shortness of breath and wheezing.   Neurological:  Negative for dizziness.     Physical Exam Triage Vital Signs ED Triage Vitals  Enc Vitals Group     BP 07/22/22 1323 (!) 143/90     Pulse Rate 07/22/22 1323 (!) 102     Resp 07/22/22 1323 14     Temp 07/22/22 1323 98.5 F (36.9 C)     Temp Source 07/22/22 1323 Oral     SpO2 07/22/22 1323 98 %     Weight 07/22/22 1320 182 lb (82.6 kg)     Height 07/22/22 1320 '5\' 2"'$  (1.575 m)     Head Circumference --      Peak Flow --      Pain Score 07/22/22 1320 5     Pain Loc --      Pain Edu? --      Excl. in [place]? --    No data found.  Updated Vital Signs BP (!) 143/90 (BP Location: Left  Arm)   Pulse (!) 102   Temp 98.5 F (36.9 C) (Oral)   Resp 14   Ht '5\' 2"'$  (1.575 m)   Wt 182 lb (82.6 kg)   SpO2 98%   BMI 33.29 kg/m   Visual Acuity Right Eye Distance:   Left Eye Distance:   Bilateral Distance:    Right Eye Near:   Left Eye Near:    Bilateral Near:     Physical Exam Vitals and nursing note reviewed.  Constitutional:      Appearance: Normal appearance. She is not ill-appearing.  HENT:     Head: Normocephalic and atraumatic.     Right Ear: Tympanic membrane, ear canal and external ear normal. There is no impacted cerumen.     Left Ear: Tympanic membrane, ear canal and external ear normal. There is no impacted cerumen.     Nose: Rhinorrhea present. No congestion.     Mouth/Throat:     Mouth: Mucous membranes are moist.     Pharynx: Oropharynx is clear. No oropharyngeal exudate or posterior oropharyngeal erythema.  Cardiovascular:     Rate and Rhythm: Normal rate and regular rhythm.     Pulses: Normal pulses.     Heart sounds: Normal heart sounds. No murmur heard.    No friction rub. No gallop.  Pulmonary:     Effort: Pulmonary effort is normal.     Breath sounds: Normal breath sounds. No wheezing, rhonchi or rales.  Musculoskeletal:     Cervical back: Normal range of motion and neck supple.  Lymphadenopathy:     Cervical: No cervical adenopathy.  Skin:    General: Skin is warm and dry.     Capillary Refill: Capillary refill takes less than 2 seconds.     Findings: No erythema or rash.  Neurological:     General: No focal deficit present.     Mental Status: She is alert and oriented to person, place, and time.  Psychiatric:        Mood and Affect: Mood normal.        Behavior: Behavior normal.        Thought Content: Thought content normal.        Judgment: Judgment normal.      UC Treatments / Results  Labs (all labs ordered are listed, but only abnormal results are displayed) Labs Reviewed -  No data to display  EKG   Radiology No  results found.  Procedures Procedures (including critical care time)  Medications Ordered in UC Medications - No data to display  Initial Impression / Assessment and Plan / UC Course  I have reviewed the triage vital signs and the nursing notes.  Pertinent labs & imaging results that were available during my care of the patient were reviewed by me and considered in my medical decision making (see chart for details).   Patient is a nontoxic-appearing 61 year old female here for evaluation of 1 months worth of left ear fullness as outlined in HPI above.  She does have associated postnasal drip and nonproductive cough but no other respiratory symptoms.  She states that she recently started taking a generic allergy medication to see if would help her symptoms.  On exam patient has pearly-gray tympanic membranes bilaterally with normal light reflex and clear external auditory canals.  She has no tenderness to palpation of the eustachian tubes externally.  Nasal mucosa is pink and moist with scant clear rhinorrhea.  Oropharyngeal exam is benign.  No cervical lymphadenopathy appreciable exam.  Cardiopulmonary exam reveals S1-S2 heart sounds with regular rate and rhythm and lung sounds are" all fields.  I suspect the patient has eustachian tube dysfunction.  She states that she has been trying to equalize ears but is unable to equalize on the left.  I have encouraged her to continue taking her allergy medication and I have offered her choice of therapies of either using Flonase nasal spray or low-dose steroid pack.  She states that she would like to do the Flonase at this time as she has used that in the past.  I have also encouraged her to continue to try and equalize her ear.  If her symptoms not improve, or her symptoms worsen, she should return for reevaluation.   Final Clinical Impressions(s) / UC Diagnoses   Final diagnoses:  Eustachian tube dysfunction, left     Discharge Instructions       Take over-the-counter Zyrtec, Claritin, or Allegra once daily to help with allergic symptoms.  Instill 2 squirts of fluticasone in each nostril at bedtime nightly.  And the nasal away from the septum of your nose and follow each set of squirts with 1 squirt of nasal saline to push the particles up into your turbinates where they will take effect.  Continue to equalize your ears as shown to help clear mucus from eustachian tubes and maintain patency.       ED Prescriptions     Medication Sig Dispense Auth. Provider   fluticasone (FLONASE) 50 MCG/ACT nasal spray Place 2 sprays into both nostrils daily. 18.2 mL Margarette Canada, NP      PDMP not reviewed this encounter.   Margarette Canada, NP 07/22/22 1339

## 2022-08-01 DIAGNOSIS — H2511 Age-related nuclear cataract, right eye: Secondary | ICD-10-CM | POA: Diagnosis not present

## 2022-08-09 NOTE — Progress Notes (Unsigned)
Name: Margaret Kramer   MRN: [id number]    DOB: [birth year]   Date:08/10/2022       Progress Note  Subjective  Chief Complaint  Follow Up  HPI  History of HTN: off medication for many years and bp is at goal   Major Depression recurrent :  she has been consciously trying not to worry about things that she cannot control. She was given anti-depressants in the past but never took the pills. She was eating her emotions for a while and gained weight. She was doing well for a long time, however over the past few months she is trying to a get contractor to make an extension of her house but it is very expensive - trying to find another Chief Strategy Officer. She is frustrated . She has problems sleeps, she works 10 hour a day, gets home 9-10 pm and has difficulty unwinding . Discussed medication, she does not want to take anything daily.   Dyslipidemia: discussed risk of heart attacks and strokes is very high, explained the need to start medication   The 10-year ASCVD risk score (Arnett DK, et al., 2019) is: 14%   Values used to calculate the score:     Age: 61 years     Sex: Female     Is Non-Hispanic African American: Yes     Diabetic: Yes     Tobacco smoker: No     Systolic Blood Pressure: 379 mmHg     Is BP treated: No     HDL Cholesterol: 77 mg/dL     Total Cholesterol: 203 mg/dL   Pre-diabetes: she denies polyphagia, polyuria or polydipsia , last A1C was slightly down from  5.8 % to 5.5 % . She exercises and also tries to eat healthy   History of diverticulitis: she states she has intermittent LLQ fullness, but no fever , chills or change in bowel movement, denies change in appetite, she feels pressure before a bowel movement, no blood in stools. She had 4 episodes of diverticulitis in 2020 she has seen Dr. Ronita Hipps at [facility], to have a partial colectomy but cancelled due to [place] and since that time  only on episode of diverticulitis in 2022. No symptoms in 2023   Vitamin  D deficiency: last  level was back to normal range. Continue supplementation   Osteoarthritis: She takes Celebrex prn , no side effects and pain has improved on right knee. Marland Kitchen No swelling, redness or increase in warmth. She denies symptoms of gastritis   Chronic back pain with intermittent sciatica: she is doing some exercises at home and is better control with increase in physical active . She is thinking about seeing a chiropractor   Patient Active Problem List   Diagnosis Date Noted   Depression, major, in remission ([place]) 02/07/2022   Chronic left-sided low back pain without sciatica 01/06/2022   Vitiligo 10/15/2020   History of diverticulitis 11/22/2019   Hallux valgus, acquired 03/07/2018   Other insomnia 12/29/2016   Cardiac murmur 02/10/2016   Hypertension, benign 02/10/2016   History of syncope 02/10/2016   Prediabetes 02/10/2016   Increased pressure in the eye 02/10/2016   Vitamin D deficiency 02/10/2016   Obesity (BMI 30.0-34.9) 02/10/2016    Past Surgical History:  Procedure Laterality Date   COLONOSCOPY     COLONOSCOPY WITH PROPOFOL N/A 07/04/2018   Procedure: COLONOSCOPY WITH PROPOFOL;  Surgeon: Toledo, Benay Pike, MD;  Location: [facility] END[facility];  Service: Gastroenterology;  Laterality: N/A;   ganglion cyst removal  Right     Family History  Problem Relation Age of Onset   Diabetes Mother    Hypothyroidism Mother    Alcoholism Father    Dementia Father    Cancer Brother 8       lung cancer   Breast cancer Maternal Aunt 8   Breast cancer Cousin 85       mat cousin   Colon cancer Paternal Uncle    Pancreatic cancer Paternal Uncle     Social History   Tobacco Use   Smoking status: Never   Smokeless tobacco: Never  Substance Use Topics   Alcohol use: Yes    Alcohol/week: 0.0 standard drinks of alcohol    Comment: occasionally     Current Outpatient Medications:    celecoxib (CELEBREX) 200 MG capsule, Take 1 capsule (200 mg total) by mouth daily., Disp: 90 capsule, Rfl:  1   fluticasone (FLONASE) 50 MCG/ACT nasal spray, Place 2 sprays into both nostrils daily., Disp: 18.2 mL, Rfl: 1   Multiple Vitamin (MULTIVITAMIN) tablet, Take 1 tablet by mouth daily., Disp: , Rfl:   No Known Allergies  I personally reviewed active problem list, medication list, allergies, family history, social history, health maintenance with the patient/caregiver today.   ROS  Constitutional: Negative for fever or weight change.  Respiratory: Negative for cough and shortness of breath.   Cardiovascular: Negative for chest pain or palpitations.  Gastrointestinal: Negative for abdominal pain, no bowel changes.  Musculoskeletal: Negative for gait problem or joint swelling.  Skin: Negative for rash.  Neurological: Negative for dizziness or headache.  No other specific complaints in a complete review of systems (except as listed in HPI above).   Objective  Vitals:   08/10/22 0815  BP: 134/76  Pulse: 87  Resp: 16  SpO2: 98%  Weight: 183 lb (83 kg)  Height: '5\' 3"'$  (1.6 m)    Body mass index is 32.42 kg/m.  Physical Exam  Constitutional: Patient appears well-developed and well-nourished. Obese  No distress.  HEENT: head atraumatic, normocephalic, pupils equal and reactive to light,, neck supple Cardiovascular: Normal rate, regular rhythm and normal heart sounds.  No murmur heard. No BLE edema. Pulmonary/Chest: Effort normal and breath sounds normal. No respiratory distress. Abdominal: Soft.  There is no tenderness. Psychiatric: Patient has a normal mood and affect. behavior is normal. Judgment and thought content normal.   Recent Results (from the past 2160 hour(s))  HM DIABETES EYE EXAM     Status: None   Collection Time: 05/23/22 12:00 AM  Result Value Ref Range   HM Diabetic Eye Exam No Retinopathy No Retinopathy    PHQ2/9:    08/10/2022    8:14 AM 02/07/2022    8:26 AM 01/06/2022    9:48 AM 08/09/2021    2:44 PM 11/16/2020    8:51 AM  Depression screen PHQ 2/9   Decreased Interest 1 0 1 0 0  Down, Depressed, Hopeless 0 0 0 0 0  PHQ - 2 Score 1 0 1 0 0  Altered sleeping 3 3 0 1 3  Tired, decreased energy 0 0 0 1 1  Change in appetite 0 0 0 1 0  Feeling bad or failure about yourself  0 0 0 0 0  Trouble concentrating 1 0 0 0 0  Moving slowly or fidgety/restless 0 0 0 0 0  Suicidal thoughts 0 0 0 0 0  PHQ-9 Score '5 3 1 3 4  '$ Difficult doing work/chores    Not  difficult at all Somewhat difficult    phq 9 is positive   Fall Risk:    08/10/2022    8:14 AM 02/07/2022    8:25 AM 01/06/2022    9:48 AM 08/09/2021    2:36 PM 11/16/2020    8:51 AM  Fall Risk   Falls in the past year? 1 0 0 0 1  Number falls in past yr: 0 0 0  1  Injury with Fall? 0 0 0  0  Risk for fall due to : No Fall Risks No Fall Risks No Fall Risks  History of fall(s)  Follow up Falls prevention discussed Falls prevention discussed Falls prevention discussed Falls prevention discussed Falls evaluation completed      Functional Status Survey: Is the patient deaf or have difficulty hearing?: No Does the patient have difficulty seeing, even when wearing glasses/contacts?: Yes Does the patient have difficulty concentrating, remembering, or making decisions?: No Does the patient have difficulty walking or climbing stairs?: No Does the patient have difficulty dressing or bathing?: No Does the patient have difficulty doing errands alone such as visiting a doctor's office or shopping?: No    Assessment & Plan  1. Primary osteoarthritis of both knees  - celecoxib (CELEBREX) 200 MG capsule; Take 1 capsule (200 mg total) by mouth daily.  Dispense: 90 capsule; Refill: 1  2. Depression, major, recurrent, mild ([place])  Refuses therapy   3. Prediabetes  Continue life style modification  4. Dyslipidemia  Refuses medication   5. Vitamin D deficiency  Continue supplementation   6. History of diverticulitis   7. Intermittent low back pain

## 2022-08-10 ENCOUNTER — Ambulatory Visit (INDEPENDENT_AMBULATORY_CARE_PROVIDER_SITE_OTHER): Payer: 59 | Admitting: Family Medicine

## 2022-08-10 ENCOUNTER — Encounter: Payer: Self-pay | Admitting: Family Medicine

## 2022-08-10 ENCOUNTER — Encounter: Payer: Self-pay | Admitting: Ophthalmology

## 2022-08-10 VITALS — BP 134/76 | HR 87 | Resp 16 | Ht 63.0 in | Wt 183.0 lb

## 2022-08-10 DIAGNOSIS — E785 Hyperlipidemia, unspecified: Secondary | ICD-10-CM | POA: Diagnosis not present

## 2022-08-10 DIAGNOSIS — R7303 Prediabetes: Secondary | ICD-10-CM | POA: Diagnosis not present

## 2022-08-10 DIAGNOSIS — E559 Vitamin D deficiency, unspecified: Secondary | ICD-10-CM

## 2022-08-10 DIAGNOSIS — M545 Low back pain, unspecified: Secondary | ICD-10-CM

## 2022-08-10 DIAGNOSIS — M170 Bilateral primary osteoarthritis of knee: Secondary | ICD-10-CM

## 2022-08-10 DIAGNOSIS — Z8719 Personal history of other diseases of the digestive system: Secondary | ICD-10-CM | POA: Diagnosis not present

## 2022-08-10 DIAGNOSIS — F330 Major depressive disorder, recurrent, mild: Secondary | ICD-10-CM | POA: Diagnosis not present

## 2022-08-10 MED ORDER — CELECOXIB 200 MG PO CAPS: 200.0000 mg | ORAL_CAPSULE | Freq: Every day | ORAL | 1 refills | Status: DC

## 2022-08-16 NOTE — Discharge Instructions (Signed)

## 2022-08-17 ENCOUNTER — Encounter
Admission: RE | Disposition: A | Discharge status: Home / Self Care | Payer: Self-pay | Source: Home / Self Care | Attending: Ophthalmology

## 2022-08-17 ENCOUNTER — Ambulatory Visit
Admission: RE | Admit: 2022-08-17 | Discharge: 2022-08-17 | Disposition: A | Discharge status: Home / Self Care | Payer: 59 | Attending: Ophthalmology | Admitting: Ophthalmology

## 2022-08-17 ENCOUNTER — Ambulatory Visit: Payer: 59 | Admitting: Anesthesiology

## 2022-08-17 ENCOUNTER — Encounter: Payer: Self-pay | Admitting: Ophthalmology

## 2022-08-17 ENCOUNTER — Other Ambulatory Visit: Payer: Self-pay

## 2022-08-17 DIAGNOSIS — H2511 Age-related nuclear cataract, right eye: Secondary | ICD-10-CM | POA: Diagnosis not present

## 2022-08-17 DIAGNOSIS — E669 Obesity, unspecified: Secondary | ICD-10-CM | POA: Diagnosis not present

## 2022-08-17 DIAGNOSIS — Z6832 Body mass index (BMI) 32.0-32.9, adult: Secondary | ICD-10-CM | POA: Insufficient documentation

## 2022-08-17 DIAGNOSIS — I10 Essential (primary) hypertension: Secondary | ICD-10-CM | POA: Diagnosis not present

## 2022-08-17 DIAGNOSIS — M1990 Unspecified osteoarthritis, unspecified site: Secondary | ICD-10-CM | POA: Insufficient documentation

## 2022-08-17 DIAGNOSIS — F32A Depression, unspecified: Secondary | ICD-10-CM | POA: Diagnosis not present

## 2022-08-17 DIAGNOSIS — H269 Unspecified cataract: Secondary | ICD-10-CM | POA: Diagnosis not present

## 2022-08-17 HISTORY — DX: Cardiac murmur, unspecified: R01.1

## 2022-08-17 HISTORY — PX: CATARACT EXTRACTION W/PHACO: SHX586

## 2022-08-17 SURGERY — PHACOEMULSIFICATION, CATARACT, WITH IOL INSERTION
Anesthesia: Monitor Anesthesia Care | Site: Eye | Laterality: Right

## 2022-08-17 MED ORDER — LACTATED RINGERS IV SOLN: INTRAVENOUS | Status: DC

## 2022-08-17 MED ORDER — TETRACAINE HCL 0.5 % OP SOLN
1.0000 [drp] | OPHTHALMIC | Status: DC | PRN
  Administered 2022-08-17 (×3): 1 [drp] via OPHTHALMIC

## 2022-08-17 MED ORDER — SIGHTPATH DOSE#1 BSS IO SOLN
INTRAOCULAR | Status: DC | PRN
  Administered 2022-08-17: 53 mL via OPHTHALMIC

## 2022-08-17 MED ORDER — SIGHTPATH DOSE#1 BSS IO SOLN
INTRAOCULAR | Status: DC | PRN
  Administered 2022-08-17: 15 mL via INTRAOCULAR

## 2022-08-17 MED ORDER — BRIMONIDINE TARTRATE-TIMOLOL 0.2-0.5 % OP SOLN
OPHTHALMIC | Status: DC | PRN
  Administered 2022-08-17: 1 [drp] via OPHTHALMIC

## 2022-08-17 MED ORDER — CEFUROXIME OPHTHALMIC INJECTION 1 MG/0.1 ML
INJECTION | OPHTHALMIC | Status: DC | PRN
  Administered 2022-08-17: 0.1 mL via INTRACAMERAL

## 2022-08-17 MED ORDER — MIDAZOLAM HCL 2 MG/2ML IJ SOLN
INTRAMUSCULAR | Status: DC | PRN
  Administered 2022-08-17 (×2): 1 mg via INTRAVENOUS

## 2022-08-17 MED ORDER — SIGHTPATH DOSE#1 NA HYALUR & NA CHOND-NA HYALUR IO KIT
PACK | INTRAOCULAR | Status: DC | PRN
  Administered 2022-08-17: 1 via OPHTHALMIC

## 2022-08-17 MED ORDER — ACETAMINOPHEN 160 MG/5ML PO SOLN: 325.0000 mg | ORAL | Status: DC | PRN

## 2022-08-17 MED ORDER — FENTANYL CITRATE (PF) 100 MCG/2ML IJ SOLN
INTRAMUSCULAR | Status: DC | PRN
  Administered 2022-08-17: 50 ug via INTRAVENOUS
  Administered 2022-08-17: 25 ug via INTRAVENOUS

## 2022-08-17 MED ORDER — ACETAMINOPHEN 325 MG PO TABS: 650.0000 mg | ORAL_TABLET | Freq: Once | ORAL | Status: DC | PRN

## 2022-08-17 MED ORDER — SIGHTPATH DOSE#1 BSS IO SOLN
INTRAOCULAR | Status: DC | PRN
  Administered 2022-08-17: 2 mL

## 2022-08-17 MED ORDER — ONDANSETRON HCL 4 MG/2ML IJ SOLN: 4.0000 mg | Freq: Once | INTRAMUSCULAR | Status: DC | PRN

## 2022-08-17 MED ORDER — ARMC OPHTHALMIC DILATING DROPS
1.0000 | OPHTHALMIC | Status: DC | PRN
  Administered 2022-08-17 (×3): 1 via OPHTHALMIC

## 2022-08-17 SURGICAL SUPPLY — 13 items
CANNULA ANT/CHMB 27G (MISCELLANEOUS) IMPLANT
CANNULA ANT/CHMB 27GA (MISCELLANEOUS) IMPLANT
CATARACT SUITE SIGHTPATH (MISCELLANEOUS) ×1 IMPLANT
FEE CATARACT SUITE SIGHTPATH (MISCELLANEOUS) ×1 IMPLANT
GLOVE SRG 8 PF TXTR STRL LF DI (GLOVE) ×1 IMPLANT
GLOVE SURG ENC TEXT LTX SZ7.5 (GLOVE) ×1 IMPLANT
GLOVE SURG UNDER POLY LF SZ8 (GLOVE) ×1
LENS IOL TECNIS EYHANCE 21.0 (Intraocular Lens) IMPLANT
NDL FILTER BLUNT 18X1 1/2 (NEEDLE) ×1 IMPLANT
NEEDLE FILTER BLUNT 18X1 1/2 (NEEDLE) ×1 IMPLANT
SYR 3ML LL SCALE MARK (SYRINGE) ×1 IMPLANT
WATER STERILE IRR 250ML POUR (IV SOLUTION) ×1 IMPLANT
WICK EYE OCUCEL (MISCELLANEOUS) IMPLANT

## 2022-08-17 NOTE — Anesthesia Preprocedure Evaluation (Addendum)
Anesthesia Evaluation  Patient identified by MRN, date of birth, ID band Patient awake    Reviewed: Allergy & Precautions, NPO status , Patient's Chart, lab work & pertinent test results  History of Anesthesia Complications Negative for: history of anesthetic complications  Airway Mallampati: III   Neck ROM: Full    Dental  (+) Missing   Pulmonary neg pulmonary ROS,    Pulmonary exam normal breath sounds clear to auscultation       Cardiovascular hypertension, Normal cardiovascular exam Rhythm:Regular Rate:Normal     Neuro/Psych PSYCHIATRIC DISORDERS Depression negative neurological ROS     GI/Hepatic negative GI ROS,   Endo/Other  Prediabetes, obesity  Renal/GU negative Renal ROS     Musculoskeletal  (+) Arthritis ,   Abdominal   Peds  Hematology negative hematology ROS (+)   Anesthesia Other Findings   Reproductive/Obstetrics                            Anesthesia Physical Anesthesia Plan  ASA: 2  Anesthesia Plan: MAC   Post-op Pain Management:    Induction: Intravenous  PONV Risk Score and Plan: 2 and Treatment may vary due to age or medical condition, Midazolam and TIVA  Airway Management Planned: Natural Airway and Nasal Cannula  Additional Equipment:   Intra-op Plan:   Post-operative Plan:   Informed Consent: I have reviewed the patients History and Physical, chart, labs and discussed the procedure including the risks, benefits and alternatives for the proposed anesthesia with the patient or authorized representative who has indicated his/her understanding and acceptance.     Dental advisory given  Plan Discussed with: CRNA  Anesthesia Plan Comments: (LMA/GETA backup discussed.  Patient consented for risks of anesthesia including but not limited to:  - adverse reactions to medications - damage to eyes, teeth, lips or other oral mucosa - nerve damage due to  positioning  - sore throat or hoarseness - damage to heart, brain, nerves, lungs, other parts of body or loss of life  Informed patient about role of CRNA in peri- and intra-operative care.  Patient voiced understanding.)        Anesthesia Quick Evaluation

## 2022-08-17 NOTE — H&P (Signed)
[facility]   Primary Care Physician:  Steele Sizer, MD Ophthalmologist: Dr. Leandrew Koyanagi  Pre-Procedure History & Physical: HPI:  Margaret Kramer is a 61 y.o. female here for ophthalmic surgery.   Past Medical History:  Diagnosis Date   Allergy    Depression 12/29/2016   mild   Diverticulitis    Dyspnea    Heart murmur    History of syncope    Hypertension    Increased pressure in the eye 02/10/2016   Pre-diabetes    Vitamin D deficiency     Past Surgical History:  Procedure Laterality Date   COLONOSCOPY     COLONOSCOPY WITH PROPOFOL N/A 07/04/2018   Procedure: COLONOSCOPY WITH PROPOFOL;  Surgeon: Toledo, Benay Pike, MD;  Location: [facility] ENDOSCOPY;  Service: Gastroenterology;  Laterality: N/A;   ganglion cyst removal Right     Prior to Admission medications   Medication Sig Start Date End Date Taking? Authorizing Provider  celecoxib (CELEBREX) 200 MG capsule Take 1 capsule (200 mg total) by mouth daily. 08/10/22  Yes Sowles, Drue Stager, MD  Cholecalciferol (VITAMIN D3 PO) Take by mouth daily.   Yes [provider]  FIBER PO Take by mouth daily.   Yes [provider]  fluticasone (FLONASE) 50 MCG/ACT nasal spray Place 2 sprays into both nostrils daily. 07/22/22  Yes Margarette Canada, NP  Multiple Vitamin (MULTIVITAMIN) tablet Take 1 tablet by mouth daily.   Yes [provider]    Allergies as of 05/24/2022   (No Known Allergies)    Family History  Problem Relation Age of Onset   Diabetes Mother    Hypothyroidism Mother    Alcoholism Father    Dementia Father    Cancer Brother 18       lung cancer   Breast cancer Maternal Aunt 65   Breast cancer Cousin 6       mat cousin   Colon cancer Paternal Uncle    Pancreatic cancer Paternal Uncle     Social History   Socioeconomic History   Marital status: Single    Spouse name: Not on file   Number of children: 0   Years of education: Not on file   Highest education level:  Not on file  Occupational History   Not on file  Tobacco Use   Smoking status: Never   Smokeless tobacco: Never  Vaping Use   Vaping Use: Never used  Substance and Sexual Activity   Alcohol use: Yes    Alcohol/week: 0.0 standard drinks of alcohol    Comment: occasionally   Drug use: No   Sexual activity: Never  Other Topics Concern   Not on file  Social History Narrative   Not on file   Social Determinants of Health   Financial Resource Strain: Low Risk  (05/14/2020)   Overall Financial Resource Strain (CARDIA)    Difficulty of Paying Living Expenses: Not hard at all  Food Insecurity: No Food Insecurity (05/14/2020)   Hunger Vital Sign    Worried About Running Out of Food in the Last Year: Never true    Ran Out of Food in the Last Year: Never true  Transportation Needs: No Transportation Needs (02/07/2022)   PRAPARE - Hydrologist (Medical): No    Lack of Transportation (Non-Medical): No  Physical Activity: Insufficiently Active (05/14/2020)   Exercise Vital Sign    Days of Exercise per Week: 4 days    Minutes of Exercise per Session: 30  min  Stress: No Stress Concern Present (05/14/2020)   [place]    Feeling of Stress : Not at all  Social Connections: Moderately Integrated (05/14/2020)   Social Connection and Isolation Panel [NHANES]    Frequency of Communication with Friends and Family: More than three times a week    Frequency of Social Gatherings with Friends and Family: More than three times a week    Attends Religious Services: More than 4 times per year    Active Member of [employer] or Organizations: Yes    Attends Archivist Meetings: More than 4 times per year    Marital Status: Never married  Intimate Partner Violence: Not At Risk (05/14/2020)   Humiliation, Afraid, Rape, and Kick questionnaire    Fear of Current or Ex-Partner: No    Emotionally Abused: No     Physically Abused: No    Sexually Abused: No    Review of Systems: See HPI, otherwise negative ROS  Physical Exam: BP (!) 136/94   Pulse 69   Temp 97.7 F (36.5 C) (Temporal)   Ht '5\' 3"'$  (1.6 m)   Wt 82.1 kg   SpO2 100%   BMI 32.06 kg/m  General:   Alert,  pleasant and cooperative in NAD Head:  Normocephalic and atraumatic. Lungs:  Clear to auscultation.    Heart:  Regular rate and rhythm.   Impression/Plan: Margaret Kramer is here for ophthalmic surgery.  Risks, benefits, limitations, and alternatives regarding ophthalmic surgery have been reviewed with the patient.  Questions have been answered.  All parties agreeable.   Leandrew Koyanagi, MD  08/17/2022, 10:11 AM

## 2022-08-17 NOTE — Anesthesia Postprocedure Evaluation (Signed)
Anesthesia Post Note  Patient: DEVIN FOSKEY  Procedure(s) Performed: CATARACT EXTRACTION PHACO AND INTRAOCULAR LENS PLACEMENT (IOC) RIGHT DIABETIC 3.79 00:30.9 (Right: Eye)  Patient location during evaluation: PACU Anesthesia Type: MAC Level of consciousness: awake and alert, oriented and patient cooperative Pain management: pain level controlled Vital Signs Assessment: post-procedure vital signs reviewed and stable Respiratory status: spontaneous breathing, nonlabored ventilation and respiratory function stable Cardiovascular status: blood pressure returned to baseline and stable Postop Assessment: adequate PO intake Anesthetic complications: no   There were no known notable events for this encounter.   Last Vitals:  Vitals:   08/17/22 1122 08/17/22 1129  BP: (!) 137/95 (!) 146/91  Pulse: 76 (!) 56  Resp: 14 10  Temp: 36.4 C 36.4 C  SpO2: 99% 97%    Last Pain:  Vitals:   08/17/22 1129  TempSrc:   PainSc: 0-No pain                 Darrin Nipper

## 2022-08-17 NOTE — Op Note (Signed)
LOCATION:  [place]   PREOPERATIVE DIAGNOSIS:    Nuclear sclerotic cataract right eye. H25.11   POSTOPERATIVE DIAGNOSIS:  Nuclear sclerotic cataract right eye.     PROCEDURE:  Phacoemusification with posterior chamber intraocular lens placement of the right eye   ULTRASOUND TIME: Procedure(s): CATARACT EXTRACTION PHACO AND INTRAOCULAR LENS PLACEMENT (IOC) RIGHT DIABETIC 3.79 00:30.9 (Right)  LENS:   Implant Name Type Inv. Item Serial No. Manufacturer Lot No. LRB No. Used Action  LENS IOL TECNIS EYHANCE 21.0 - [id number] Intraocular Lens LENS IOL TECNIS EYHANCE 21.0 [id number] [employer]  Right 1 Implanted         SURGEON:  Wyonia Hough, MD   ANESTHESIA:  Topical with tetracaine drops and 2% Xylocaine jelly, augmented with 1% preservative-free intracameral lidocaine.    COMPLICATIONS:  None.   DESCRIPTION OF PROCEDURE:  The patient was identified in the holding room and transported to the operating room and placed in the supine position under the operating microscope.  The right eye was identified as the operative eye and it was prepped and draped in the usual sterile ophthalmic fashion.   A 1 millimeter clear-corneal paracentesis was made at the 12:00 position.  0.5 ml of preservative-free 1% lidocaine was injected into the anterior chamber. The anterior chamber was filled with Viscoat viscoelastic.  A 2.4 millimeter keratome was used to make a near-clear corneal incision at the 9:00 position.  A curvilinear capsulorrhexis was made with a cystotome and capsulorrhexis forceps.  Balanced salt solution was used to hydrodissect and hydrodelineate the nucleus.   Phacoemulsification was then used in stop and chop fashion to remove the lens nucleus and epinucleus.  The remaining cortex was then removed using the irrigation and aspiration handpiece. Provisc was then placed into the capsular bag to distend it for lens placement.  A lens was then injected into the capsular  bag.  The remaining viscoelastic was aspirated.   Wounds were hydrated with balanced salt solution.  The anterior chamber was inflated to a physiologic pressure with balanced salt solution.  No wound leaks were noted. Cefuroxime 0.1 ml of a '10mg'$ /ml solution was injected into the anterior chamber for a dose of 1 mg of intracameral antibiotic at the completion of the case.   Timolol and Brimonidine drops were applied to the eye.  The patient was taken to the recovery room in stable condition without complications of anesthesia or surgery.   Judith Demps 08/17/2022, 11:22 AM

## 2022-08-17 NOTE — Transfer of Care (Signed)
Immediate Anesthesia Transfer of Care Note  Patient: Margaret Kramer  Procedure(s) Performed: CATARACT EXTRACTION PHACO AND INTRAOCULAR LENS PLACEMENT (IOC) RIGHT DIABETIC 3.79 00:30.9 (Right: Eye)  Patient Location: PACU  Anesthesia Type: MAC  Level of Consciousness: awake, alert  and patient cooperative  Airway and Oxygen Therapy: Patient Spontanous Breathing and Patient connected to supplemental oxygen  Post-op Assessment: Post-op Vital signs reviewed, Patient's Cardiovascular Status Stable, Respiratory Function Stable, Patent Airway and No signs of Nausea or vomiting  Post-op Vital Signs: Reviewed and stable  Complications: There were no known notable events for this encounter.

## 2022-08-18 ENCOUNTER — Encounter: Payer: Self-pay | Admitting: Ophthalmology

## 2022-08-26 ENCOUNTER — Encounter: Payer: Self-pay | Admitting: Family Medicine

## 2022-08-29 ENCOUNTER — Other Ambulatory Visit: Payer: Self-pay | Admitting: Family Medicine

## 2022-08-29 MED ORDER — HYDROCHLOROTHIAZIDE 12.5 MG PO TABS: 12.5000 mg | ORAL_TABLET | Freq: Every day | ORAL | 0 refills | Status: DC

## 2022-10-28 ENCOUNTER — Other Ambulatory Visit: Payer: Self-pay | Admitting: Family Medicine

## 2022-12-30 DIAGNOSIS — H2000 Unspecified acute and subacute iridocyclitis: Secondary | ICD-10-CM | POA: Diagnosis not present

## 2023-01-05 DIAGNOSIS — H209 Unspecified iridocyclitis: Secondary | ICD-10-CM | POA: Diagnosis not present

## 2023-02-07 NOTE — Patient Instructions (Signed)
Preventive Care 40-64 Years Old, Female Preventive care refers to lifestyle choices and visits with your health care provider that can promote health and wellness. Preventive care visits are also called wellness exams. What can I expect for my preventive care visit? Counseling Your health care provider may ask you questions about your: Medical history, including: Past medical problems. Family medical history. Pregnancy history. Current health, including: Menstrual cycle. Method of birth control. Emotional well-being. Home life and relationship well-being. Sexual activity and sexual health. Lifestyle, including: Alcohol, nicotine or tobacco, and drug use. Access to firearms. Diet, exercise, and sleep habits. Work and work environment. Sunscreen use. Safety issues such as seatbelt and bike helmet use. Physical exam Your health care provider will check your: Height and weight. These may be used to calculate your BMI (body mass index). BMI is a measurement that tells if you are at a healthy weight. Waist circumference. This measures the distance around your waistline. This measurement also tells if you are at a healthy weight and may help predict your risk of certain diseases, such as type 2 diabetes and high blood pressure. Heart rate and blood pressure. Body temperature. Skin for abnormal spots. What immunizations do I need?  Vaccines are usually given at various ages, according to a schedule. Your health care provider will recommend vaccines for you based on your age, medical history, and lifestyle or other factors, such as travel or where you work. What tests do I need? Screening Your health care provider may recommend screening tests for certain conditions. This may include: Lipid and cholesterol levels. Diabetes screening. This is done by checking your blood sugar (glucose) after you have not eaten for a while (fasting). Pelvic exam and Pap test. Hepatitis B test. Hepatitis C  test. HIV (human immunodeficiency virus) test. STI (sexually transmitted infection) testing, if you are at risk. Lung cancer screening. Colorectal cancer screening. Mammogram. Talk with your health care provider about when you should start having regular mammograms. This may depend on whether you have a family history of breast cancer. BRCA-related cancer screening. This may be done if you have a family history of breast, ovarian, tubal, or peritoneal cancers. Bone density scan. This is done to screen for osteoporosis. Talk with your health care provider about your test results, treatment options, and if necessary, the need for more tests. Follow these instructions at home: Eating and drinking  Eat a diet that includes fresh fruits and vegetables, whole grains, lean protein, and low-fat dairy products. Take vitamin and mineral supplements as recommended by your health care provider. Do not drink alcohol if: Your health care provider tells you not to drink. You are pregnant, may be pregnant, or are planning to become pregnant. If you drink alcohol: Limit how much you have to 0-1 drink a day. Know how much alcohol is in your drink. In the [place] one drink equals one 12 oz bottle of beer (355 mL), one 5 oz glass of wine (148 mL), or one 1 oz glass of hard liquor (44 mL). Lifestyle Brush your teeth every morning and night with fluoride toothpaste. Floss one time each day. Exercise for at least 30 minutes 5 or more days each week. Do not use any products that contain nicotine or tobacco. These products include cigarettes, chewing tobacco, and vaping devices, such as e-cigarettes. If you need help quitting, ask your health care provider. Do not use drugs. If you are sexually active, practice safe sex. Use a condom or other form of protection to   prevent STIs. If you do not wish to become pregnant, use a form of birth control. If you plan to become pregnant, see your health care provider for a  prepregnancy visit. Take aspirin only as told by your health care provider. Make sure that you understand how much to take and what form to take. Work with your health care provider to find out whether it is safe and beneficial for you to take aspirin daily. Find healthy ways to manage stress, such as: Meditation, yoga, or listening to music. Journaling. Talking to a trusted person. Spending time with friends and family. Minimize exposure to UV radiation to reduce your risk of skin cancer. Safety Always wear your seat belt while driving or riding in a vehicle. Do not drive: If you have been drinking alcohol. Do not ride with someone who has been drinking. When you are tired or distracted. While texting. If you have been using any mind-altering substances or drugs. Wear a helmet and other protective equipment during sports activities. If you have firearms in your house, make sure you follow all gun safety procedures. Seek help if you have been physically or sexually abused. What's next? Visit your health care provider once a year for an annual wellness visit. Ask your health care provider how often you should have your eyes and teeth checked. Stay up to date on all vaccines. This information is not intended to replace advice given to you by your health care provider. Make sure you discuss any questions you have with your health care provider. Document Revised: 04/28/2021 Document Reviewed: 04/28/2021 Elsevier Patient Education  2023 Elsevier Inc.  

## 2023-02-07 NOTE — Progress Notes (Unsigned)
Name: Margaret Kramer   MRN: [id number]    DOB: 12-Jul-1961   Date:02/08/2023       Progress Note  Subjective  Chief Complaint  Annual Exam  HPI  Patient presents for annual CPE and follow up  HTN: she stopped taking hctz again, she asked to resume medication last visit because she was under more stress.    Major Depression recurrent :  she has been consciously trying not to worry about things that she cannot control. She was given anti-depressants in the past but never took the pills. She was eating her emotions for a while and gained weight. She was doing well for a long time, but last year had increase of stress due to remodeling her house. She also lost her brother in Dec. She is not taking any medications at this time.    Dyslipidemia: discussed risk of heart attacks and strokes is very high, explained the need to start medication but she is not interested    The 10-year ASCVD risk score (Arnett DK, et al., 2019) is: 13.4%   Values used to calculate the score:     Age: 62 years     Sex: Female     Is Non-Hispanic African American: Yes     Diabetic: Yes     Tobacco smoker: No     Systolic Blood Pressure: [id number] mmHg     Is BP treated: No     HDL Cholesterol: 77 mg/dL     Total Cholesterol: 203 mg/dL    Pre-diabetes: she denies polyphagia, polyuria or polydipsia , last A1C was slightly down from  5.8 % to 5.5 % . She exercises and also tries to eat healthy . We will recheck labs today    History of diverticulitis: she states she has intermittent LLQ fullness, but no fever , chills or change in bowel movement, denies change in appetite, she feels pressure before a bowel movement, no blood in stools. She had 4 episodes of diverticulitis in 2020 she has seen Dr. Ronita Hipps at [facility], to have a partial colectomy but cancelled due to [place] and since that time  only on episode of diverticulitis in 2022. No symptoms in over one year    Vitamin  D deficiency: last level was back to normal  range. Continue supplementation    Osteoarthritis: She takes Celebrex prn , no side effects and pain has improved on right knee. Marland Kitchen No swelling, redness or increase in warmth. Unchanged    Chronic back pain with intermittent sciatica: she is doing some exercises at home and is better control with increase in physical active . She did not get to see chiropractor yet   OA both knees: she states lately having more intense pain on right knee, sharp and intense some effusion, pain is on medical knee, never seen by ortho, he takes celebrex prn   Diet: drinking more fruit smoothies, avoiding fried food  Exercise:  still walks at lunch  Last Eye Exam: up to date, recent eye surgery  Last Dental Exam: up to date   [place] Visit from [facility]/08/2023 in [facility]  AUDIT-C Score 1      Depression: Phq 9 is  negative    02/08/2023    8:35 AM 08/10/2022    8:14 AM 02/07/2022    8:26 AM 01/06/2022    9:48 AM 08/09/2021    2:44 PM  Depression screen PHQ 2/9  Decreased Interest 0 1 0 1 0  Down, Depressed, Hopeless 0 0 0 0 0  PHQ - 2 Score 0 1 0 1 0  Altered sleeping 1 3 3  0 1  Tired, decreased energy 0 0 0 0 1  Change in appetite 1 0 0 0 1  Feeling bad or failure about yourself  0 0 0 0 0  Trouble concentrating 0 1 0 0 0  Moving slowly or fidgety/restless 0 0 0 0 0  Suicidal thoughts 0 0 0 0 0  PHQ-9 Score 2 5 3 1 3   Difficult doing work/chores Not difficult at all    Not difficult at all   Hypertension: BP Readings from Last 3 Encounters:  02/08/23 132/78  08/17/22 (!) 146/91  08/10/22 134/76   Obesity: Wt Readings from Last 3 Encounters:  02/08/23 181 lb 12.8 oz (82.5 kg)  08/17/22 181 lb (82.1 kg)  08/10/22 183 lb (83 kg)   BMI Readings from Last 3 Encounters:  02/08/23 32.20 kg/m  08/17/22 32.06 kg/m  08/10/22 32.42 kg/m     Vaccines:   HPV: N/A Tdap: up to date Shingrix: up to date Pneumonia: N/A Flu: yearly at work  COVID-19: up  to date   Hep C Screening: 12/24/19 STD testing and prevention (HIV/chl/gon/syphilis): 05/06/18 Intimate partner violence: negative screen  Sexual History : not sexually active  Menstrual History/LMP/Abnormal Bleeding: s/p menopause  Discussed importance of follow up if any post-menopausal bleeding: yes  Incontinence Symptoms: negative for symptoms   Breast cancer:  - Last Mammogram: 06/23/22 - BRCA gene screening: N/A  Osteoporosis Prevention : Discussed high calcium and vitamin D supplementation, weight bearing exercises Bone density: N/A   Cervical cancer screening: 05/14/20  Skin cancer: Discussed monitoring for atypical lesions  Colorectal cancer: 07/04/18   Lung cancer:  Low Dose CT Chest recommended if Age 61-80 years, 20 pack-year currently smoking OR have quit w/in 15years. Patient does not qualify for screen   ECG: 12/24/19  Advanced Care Planning: A voluntary discussion about advance care planning including the explanation and discussion of advance directives.  Discussed health care proxy and Living will, and the patient was able to identify a health care proxy as Landry Dyke - her niece .  Patient does not have a living will and power of attorney of health care   Lipids: Lab Results  Component Value Date   CHOL 203 (H) 02/07/2022   CHOL 213 (H) 11/16/2020   CHOL 212 (H) 12/24/2019   Lab Results  Component Value Date   HDL 77 02/07/2022   HDL 76 11/16/2020   HDL 76 12/24/2019   Lab Results  Component Value Date   LDLCALC 111 (H) 02/07/2022   LDLCALC 112 (H) 11/16/2020   LDLCALC 117 (H) 12/24/2019   Lab Results  Component Value Date   TRIG 66 02/07/2022   TRIG 132 11/16/2020   TRIG 89 12/24/2019   Lab Results  Component Value Date   CHOLHDL 2.6 02/07/2022   CHOLHDL 2.8 11/16/2020   CHOLHDL 2.8 12/24/2019   No results found for: "LDLDIRECT"  Glucose: Glucose  Date Value Ref Range Status  11/30/2012 68 65 - 99 mg/dL Final   Glucose, Bld  Date Value Ref Range  Status  02/07/2022 96 65 - 99 mg/dL Final    Comment:    .            Fasting reference interval .   11/16/2020 98 65 - 99 mg/dL Final    Comment:    .  Fasting reference interval .   12/24/2019 85 65 - 99 mg/dL Final    Comment:    .            Fasting reference interval .     Patient Active Problem List   Diagnosis Date Noted   Depression, major, in remission ([place]) 02/07/2022   Chronic left-sided low back pain without sciatica 01/06/2022   Vitiligo 10/15/2020   History of diverticulitis 11/22/2019   Hallux valgus, acquired 03/07/2018   Other insomnia 12/29/2016   Cardiac murmur 02/10/2016   Hypertension, benign 02/10/2016   History of syncope 02/10/2016   Prediabetes 02/10/2016   Increased pressure in the eye 02/10/2016   Vitamin D deficiency 02/10/2016   Obesity (BMI 30.0-34.9) 02/10/2016    Past Surgical History:  Procedure Laterality Date   CATARACT EXTRACTION W/PHACO Right 08/17/2022   Procedure: CATARACT EXTRACTION PHACO AND INTRAOCULAR LENS PLACEMENT (IOC) RIGHT DIABETIC 3.79 00:30.9;  Surgeon: Leandrew Koyanagi, MD;  Location: [place];  Service: Ophthalmology;  Laterality: Right;   COLONOSCOPY     COLONOSCOPY WITH PROPOFOL N/A 07/04/2018   Procedure: COLONOSCOPY WITH PROPOFOL;  Surgeon: Toledo, Benay Pike, MD;  Location: [facility] ENDOSCOPY;  Service: Gastroenterology;  Laterality: N/A;   ganglion cyst removal Right     Family History  Problem Relation Age of Onset   Diabetes Mother    Hypothyroidism Mother    Alcoholism Father    Dementia Father    Cancer Brother 28       lung cancer   Breast cancer Maternal Aunt 80   Colon cancer Paternal Uncle    Pancreatic cancer Paternal Uncle    Breast cancer Cousin 38       mat cousin    Social History   Socioeconomic History   Marital status: Single    Spouse name: Not on file   Number of children: 0   Years of education: Not on file   Highest education level: Not on file   Occupational History   Not on file  Tobacco Use   Smoking status: Never   Smokeless tobacco: Never  Vaping Use   Vaping Use: Never used  Substance and Sexual Activity   Alcohol use: Yes    Alcohol/week: 0.0 standard drinks of alcohol    Comment: occasionally   Drug use: No   Sexual activity: Never  Other Topics Concern   Not on file  Social History Narrative   Not on file   Social Determinants of Health   Financial Resource Strain: Low Risk  (05/14/2020)   Overall Financial Resource Strain (CARDIA)    Difficulty of Paying Living Expenses: Not hard at all  Food Insecurity: No Food Insecurity (02/08/2023)   Hunger Vital Sign    Worried About Running Out of Food in the Last Year: Never true    Ran Out of Food in the Last Year: Never true  Transportation Needs: No Transportation Needs (02/08/2023)   PRAPARE - Hydrologist (Medical): No    Lack of Transportation (Non-Medical): No  Physical Activity: Insufficiently Active (02/08/2023)   Exercise Vital Sign    Days of Exercise per Week: 2 days    Minutes of Exercise per Session: 40 min  Stress: Stress Concern Present (02/08/2023)   [place]    Feeling of Stress : To some extent  Social Connections: Moderately Isolated (02/08/2023)   Social Connection and Isolation Panel [NHANES]  Frequency of Communication with Friends and Family: Three times a week    Frequency of Social Gatherings with Friends and Family: Once a week    Attends Religious Services: More than 4 times per year    Active Member of [employer] or Organizations: No    Attends Archivist Meetings: Never    Marital Status: Never married  Intimate Partner Violence: Not At Risk (02/08/2023)   Humiliation, Afraid, Rape, and Kick questionnaire    Fear of Current or Ex-Partner: No    Emotionally Abused: No    Physically Abused: No    Sexually Abused: No     Current  Outpatient Medications:    celecoxib (CELEBREX) 200 MG capsule, Take 1 capsule (200 mg total) by mouth daily., Disp: 90 capsule, Rfl: 1   Cholecalciferol (VITAMIN D3 PO), Take by mouth daily., Disp: , Rfl:    cyclopentolate (CYCLODRYL,CYCLOGYL) 1 % ophthalmic solution, 1 drop 3 (three) times daily., Disp: , Rfl:    FIBER PO, Take by mouth daily., Disp: , Rfl:    fluticasone (FLONASE) 50 MCG/ACT nasal spray, Place 2 sprays into both nostrils daily., Disp: 18.2 mL, Rfl: 1   Multiple Vitamin (MULTIVITAMIN) tablet, Take 1 tablet by mouth daily., Disp: , Rfl:   Allergies  Allergen Reactions   Shellfish Allergy Itching    Mouth and tongue     ROS  Constitutional: Negative for fever or weight change.  Respiratory: Negative for cough and shortness of breath.   Cardiovascular: Negative for chest pain or palpitations.  Gastrointestinal: Negative for abdominal pain, no bowel changes.  Musculoskeletal: positive for gait problem due to right knee pain, joint swelling.  Skin: Negative for rash.  Neurological: Negative for dizziness or headache.  No other specific complaints in a complete review of systems (except as listed in HPI above).   Objective  Vitals:   02/08/23 0818  BP: 132/78  Pulse: 76  Resp: 16  Temp: 97.9 F (36.6 C)  TempSrc: Oral  SpO2: 96%  Weight: 181 lb 12.8 oz (82.5 kg)  Height: 5\' 3"  (1.6 m)    Body mass index is 32.2 kg/m.  Physical Exam  Constitutional: Patient appears well-developed and well-nourished. No distress.  HENT: Head: Normocephalic and atraumatic. Ears: B TMs ok, no erythema or effusion; Nose: Nose normal. Mouth/Throat: Oropharynx is clear and moist. No oropharyngeal exudate.  Eyes: Conjunctivae and EOM are normal. Pupils are equal, round, and reactive to light. No scleral icterus.  Neck: Normal range of motion. Neck supple. No JVD present. No thyromegaly present.  Cardiovascular: Normal rate, regular rhythm and normal heart sounds.  No murmur  heard. No BLE edema. Pulmonary/Chest: Effort normal and breath sounds normal. No respiratory distress. Abdominal: Soft. Bowel sounds are normal, no distension. There is no tenderness. no masses Breast: no lumps or masses, no nipple discharge or rashes FEMALE GENITALIA:  External genitalia normal External urethra normal RECTAL: not done  Musculoskeletal: Normal range of motion, no joint effusions. No gross deformities Crepitus with extension of both knees, mild effusion on right knee  Neurological: he is alert and oriented to person, place, and time. No cranial nerve deficit. Coordination, balance, strength, speech and gait are normal.  Skin: Skin is warm and dry. No rash noted. No erythema.  Psychiatric: Patient has a normal mood and affect. behavior is normal. Judgment and thought content normal.    Fall Risk:    02/08/2023    8:21 AM 08/10/2022    8:14 AM 02/07/2022  8:25 AM 01/06/2022    9:48 AM 08/09/2021    2:36 PM  Fall Risk   Falls in the past year? 1 1 0 0 0  Number falls in past yr: 0 0 0 0   Injury with Fall? 0 0 0 0   Risk for fall due to : History of fall(s) No Fall Risks No Fall Risks No Fall Risks   Follow up Falls prevention discussed;Education provided;Falls evaluation completed Falls prevention discussed Falls prevention discussed Falls prevention discussed Falls prevention discussed     Functional Status Survey: Is the patient deaf or have difficulty hearing?: No Does the patient have difficulty seeing, even when wearing glasses/contacts?: No Does the patient have difficulty concentrating, remembering, or making decisions?: No Does the patient have difficulty walking or climbing stairs?: No Does the patient have difficulty dressing or bathing?: No Does the patient have difficulty doing errands alone such as visiting a doctor's office or shopping?: No   Assessment & Plan  1. Well adult exam  - Lipid panel - CBC with Differential/Platelet - COMPLETE  METABOLIC PANEL WITH GFR - Hemoglobin A1c - VITAMIN D 25 Hydroxy (Vit-D Deficiency, Fractures)  2. Primary osteoarthritis of both knees  - Ambulatory referral to Orthopedic Surgery  3. Vitamin D deficiency  - VITAMIN D 25 Hydroxy (Vit-D Deficiency, Fractures)  4. Prediabetes  - Hemoglobin A1c  5. Depression, major, in remission ([place])  Doing well at this time  6. Hypertension, benign  - CBC with Differential/Platelet - COMPLETE METABOLIC PANEL WITH GFR  7. Intermittent low back pain   8. Dyslipidemia  - Lipid panel  9. Breast cancer screening by mammogram  - MM 3D SCREENING MAMMOGRAM BILATERAL BREAST; Future   -USPSTF grade A and B recommendations reviewed with patient; age-appropriate recommendations, preventive care, screening tests, etc discussed and encouraged; healthy living encouraged; see AVS for patient education given to patient -Discussed importance of 150 minutes of physical activity weekly, eat two servings of fish weekly, eat one serving of tree nuts ( cashews, pistachios, pecans, almonds.Marland Kitchen) every other day, eat 6 servings of fruit/vegetables daily and drink plenty of water and avoid sweet beverages.   -Reviewed Health Maintenance: Yes.

## 2023-02-08 ENCOUNTER — Ambulatory Visit (INDEPENDENT_AMBULATORY_CARE_PROVIDER_SITE_OTHER): Payer: Commercial Managed Care - PPO | Admitting: Family Medicine

## 2023-02-08 ENCOUNTER — Encounter: Payer: Self-pay | Admitting: Family Medicine

## 2023-02-08 VITALS — BP 132/78 | HR 76 | Temp 97.9°F | Resp 16 | Ht 63.0 in | Wt 181.8 lb

## 2023-02-08 DIAGNOSIS — M545 Low back pain, unspecified: Secondary | ICD-10-CM | POA: Diagnosis not present

## 2023-02-08 DIAGNOSIS — E559 Vitamin D deficiency, unspecified: Secondary | ICD-10-CM | POA: Diagnosis not present

## 2023-02-08 DIAGNOSIS — F325 Major depressive disorder, single episode, in full remission: Secondary | ICD-10-CM

## 2023-02-08 DIAGNOSIS — M170 Bilateral primary osteoarthritis of knee: Secondary | ICD-10-CM

## 2023-02-08 DIAGNOSIS — E785 Hyperlipidemia, unspecified: Secondary | ICD-10-CM

## 2023-02-08 DIAGNOSIS — Z0000 Encounter for general adult medical examination without abnormal findings: Secondary | ICD-10-CM

## 2023-02-08 DIAGNOSIS — Z1231 Encounter for screening mammogram for malignant neoplasm of breast: Secondary | ICD-10-CM

## 2023-02-08 DIAGNOSIS — R7303 Prediabetes: Secondary | ICD-10-CM | POA: Diagnosis not present

## 2023-02-08 DIAGNOSIS — I10 Essential (primary) hypertension: Secondary | ICD-10-CM

## 2023-02-09 DIAGNOSIS — H209 Unspecified iridocyclitis: Secondary | ICD-10-CM | POA: Diagnosis not present

## 2023-02-09 LAB — COMPLETE METABOLIC PANEL WITH GFR
AG Ratio: 1.4 (calc) (ref 1.0–2.5)
ALT: 18 U/L (ref 6–29)
AST: 15 U/L (ref 10–35)
Albumin: 4.3 g/dL (ref 3.6–5.1)
Alkaline phosphatase (APISO): 90 U/L (ref 37–153)
BUN: 15 mg/dL (ref 7–25)
CO2: 32 mmol/L (ref 20–32)
Calcium: 9.4 mg/dL (ref 8.6–10.4)
Chloride: 103 mmol/L (ref 98–110)
Creat: 0.67 mg/dL (ref 0.50–1.05)
Globulin: 3 g/dL (calc) (ref 1.9–3.7)
Glucose, Bld: 86 mg/dL (ref 65–99)
Potassium: 4.2 mmol/L (ref 3.5–5.3)
Sodium: 139 mmol/L (ref 135–146)
Total Bilirubin: 0.7 mg/dL (ref 0.2–1.2)
Total Protein: 7.3 g/dL (ref 6.1–8.1)
eGFR: 99 mL/min/{1.73_m2} (ref >=60)

## 2023-02-09 LAB — CBC WITH DIFFERENTIAL/PLATELET
Absolute Monocytes: 319 cells/uL (ref 200–950)
Basophils Absolute: 32 cells/uL (ref 0–200)
Basophils Relative: 0.6 %
Eosinophils Absolute: 167 cells/uL (ref 15–500)
Eosinophils Relative: 3.1 %
HCT: 37.3 % (ref 35.0–45.0)
Hemoglobin: 12.2 g/dL (ref 11.7–15.5)
Lymphs Abs: 2614 cells/uL (ref 850–3900)
MCH: 29.5 pg (ref 27.0–33.0)
MCHC: 32.7 g/dL (ref 32.0–36.0)
MCV: 90.1 fL (ref 80.0–100.0)
MPV: 10 fL (ref 7.5–12.5)
Monocytes Relative: 5.9 %
Neutro Abs: 2268 cells/uL (ref 1500–7800)
Neutrophils Relative %: 42 %
Platelets: 342 10*3/uL (ref 140–400)
RBC: 4.14 10*6/uL (ref 3.80–5.10)
RDW: 12 % (ref 11.0–15.0)
Total Lymphocyte: 48.4 %
WBC: 5.4 10*3/uL (ref 3.8–10.8)

## 2023-02-09 LAB — LIPID PANEL
Cholesterol: 219 mg/dL — ABNORMAL HIGH (ref <200)
HDL: 75 mg/dL (ref >=50)
LDL Cholesterol (Calc): 123 mg/dL (calc) — ABNORMAL HIGH
Non-HDL Cholesterol (Calc): 144 mg/dL (calc) — ABNORMAL HIGH (ref <130)
Total CHOL/HDL Ratio: 2.9 (calc) (ref <5.0)
Triglycerides: 105 mg/dL (ref <150)

## 2023-02-09 LAB — HEMOGLOBIN A1C
Hgb A1c MFr Bld: 5.9 % of total Hgb — ABNORMAL HIGH (ref <5.7)
Mean Plasma Glucose: 123 mg/dL
eAG (mmol/L): 6.8 mmol/L

## 2023-02-09 LAB — VITAMIN D 25 HYDROXY (VIT D DEFICIENCY, FRACTURES): Vit D, 25-Hydroxy: 31 ng/mL (ref 30–100)

## 2023-02-22 DIAGNOSIS — M170 Bilateral primary osteoarthritis of knee: Secondary | ICD-10-CM | POA: Diagnosis not present

## 2023-02-22 DIAGNOSIS — M25561 Pain in right knee: Secondary | ICD-10-CM | POA: Diagnosis not present

## 2023-02-22 DIAGNOSIS — M25562 Pain in left knee: Secondary | ICD-10-CM | POA: Diagnosis not present

## 2023-03-13 DIAGNOSIS — H40003 Preglaucoma, unspecified, bilateral: Secondary | ICD-10-CM | POA: Diagnosis not present

## 2023-03-15 DIAGNOSIS — H209 Unspecified iridocyclitis: Secondary | ICD-10-CM | POA: Diagnosis not present

## 2023-03-21 DIAGNOSIS — Z961 Presence of intraocular lens: Secondary | ICD-10-CM | POA: Diagnosis not present

## 2023-03-21 DIAGNOSIS — H2512 Age-related nuclear cataract, left eye: Secondary | ICD-10-CM | POA: Diagnosis not present

## 2023-03-21 DIAGNOSIS — H40003 Preglaucoma, unspecified, bilateral: Secondary | ICD-10-CM | POA: Diagnosis not present

## 2023-03-21 DIAGNOSIS — H43813 Vitreous degeneration, bilateral: Secondary | ICD-10-CM | POA: Diagnosis not present

## 2023-05-17 DIAGNOSIS — H2512 Age-related nuclear cataract, left eye: Secondary | ICD-10-CM | POA: Diagnosis not present

## 2023-06-07 DIAGNOSIS — H209 Unspecified iridocyclitis: Secondary | ICD-10-CM | POA: Diagnosis not present

## 2023-06-07 DIAGNOSIS — M349 Systemic sclerosis, unspecified: Secondary | ICD-10-CM | POA: Diagnosis not present

## 2023-06-26 ENCOUNTER — Ambulatory Visit
Admission: RE | Admit: 2023-06-26 | Discharge: 2023-06-26 | Disposition: A | Discharge status: Home / Self Care | Payer: Commercial Managed Care - PPO | Source: Ambulatory Visit | Attending: Family Medicine | Admitting: Family Medicine

## 2023-06-26 DIAGNOSIS — Z1231 Encounter for screening mammogram for malignant neoplasm of breast: Secondary | ICD-10-CM | POA: Insufficient documentation

## 2023-06-27 ENCOUNTER — Inpatient Hospital Stay: Admission: RE | Admit: 2023-06-27 | Payer: Commercial Managed Care - PPO | Source: Ambulatory Visit

## 2023-09-21 DIAGNOSIS — Z961 Presence of intraocular lens: Secondary | ICD-10-CM | POA: Diagnosis not present

## 2023-09-21 DIAGNOSIS — H209 Unspecified iridocyclitis: Secondary | ICD-10-CM | POA: Diagnosis not present

## 2023-09-21 DIAGNOSIS — H40003 Preglaucoma, unspecified, bilateral: Secondary | ICD-10-CM | POA: Diagnosis not present

## 2023-10-02 ENCOUNTER — Ambulatory Visit: Payer: Commercial Managed Care - PPO | Admitting: Physician Assistant

## 2023-10-02 ENCOUNTER — Encounter: Payer: Self-pay | Admitting: Physician Assistant

## 2023-10-02 VITALS — BP 136/84 | HR 87 | Temp 97.7°F | Resp 16 | Ht 63.0 in | Wt 176.5 lb

## 2023-10-02 DIAGNOSIS — M5442 Lumbago with sciatica, left side: Secondary | ICD-10-CM

## 2023-10-02 DIAGNOSIS — M722 Plantar fascial fibromatosis: Secondary | ICD-10-CM | POA: Diagnosis not present

## 2023-10-02 DIAGNOSIS — M25562 Pain in left knee: Secondary | ICD-10-CM | POA: Diagnosis not present

## 2023-10-02 NOTE — Patient Instructions (Signed)
  I recommend the following at this time to help relieve that discomfort:  Rest Warm compresses to the area (20 minutes on, minimum of 30 minutes off) You can alternate Tylenol and Ibuprofen for pain management but Ibuprofen is typically preferred to reduce inflammation.  You can use Celebrex instead of Ibuprofen - make sure you take it with plenty of water   I recommend using a frozen water bottle or tennis ball to help with your foot pain- step on either one of these and roll your foot back and forth to massage the sole of your foot  Gentle stretches and exercises that I have included in your paperwork Try to reduce excess strain to the area and rest as much as possible  Wear supportive shoes and, if you must lift anything, use proper lifting techniques that spare your back.   If these measures do not lead to improvement in your symptoms over the next 2-4 weeks please let us know

## 2023-10-02 NOTE — Progress Notes (Signed)
Acute Office Visit   Patient: Margaret Kramer   DOB: 27-Mar-1961   62 y.o. Female  MRN: [id number] Visit Date: 10/02/2023  Today's healthcare provider: Oswaldo Conroy Jen Eppinger, PA-C  Introduced myself to the patient as a Secondary school teacher and provided education on APPs in clinical practice.    Chief Complaint  Patient presents with   Tingling    Left hip down to leg   Pain    Knee pain all started at the beginning of the month unsure what causing it   Subjective    HPI HPI     Tingling    Additional comments: Left hip down to leg        Pain    Additional comments: Knee pain all started at the beginning of the month unsure what causing it      Last edited by Forde Radon, CMA on 10/02/2023  2:56 PM.       Left leg and hip pain/ tingling   Onset: gradual  Duration:intermittent but has become more frequent since the first of the month  Location: left lower back, left hip, left knee, left ankle  She reports tingling sensation in her left foot from toes to her sole  Radiation: unsure- she thinks it is radiating from left low back to ankle but is not sure   Pain level and character:  6/10 burning in low back, cutting and sharp in knee, stabbing in ankle  Other associated symptoms: she reports tingling in left foot  Interventions: nothing - last week she took single dose of Celebrex  Alleviating: nothing  Aggravating: standing causes back pain to hurt more,  Massaging left knee causes increased pain Her left ankle feels like it is stiff and needs to be stretched prior to using    Medications: Outpatient Medications Prior to Visit  Medication Sig   celecoxib (CELEBREX) 200 MG capsule Take 1 capsule (200 mg total) by mouth daily.   Cholecalciferol (VITAMIN D3 PO) Take by mouth daily.   cyclopentolate (CYCLODRYL,CYCLOGYL) 1 % ophthalmic solution 1 drop 3 (three) times daily.   FIBER PO Take by mouth daily.   fluticasone (FLONASE) 50 MCG/ACT nasal spray Place 2  sprays into both nostrils daily.   Multiple Vitamin (MULTIVITAMIN) tablet Take 1 tablet by mouth daily.   No facility-administered medications prior to visit.    Review of Systems  Musculoskeletal:  Positive for arthralgias, back pain and myalgias.  Neurological:  Positive for numbness. Negative for dizziness, light-headedness and headaches.        Objective    BP 136/84   Pulse 87   Temp 97.7 F (36.5 C) (Oral)   Resp 16   Ht 5\' 3"  (1.6 m)   Wt 176 lb 8 oz (80.1 kg)   SpO2 99%   BMI 31.27 kg/m     Physical Exam Vitals reviewed.  Constitutional:      General: She is awake.     Appearance: Normal appearance. She is well-developed and well-groomed.  HENT:     Head: Normocephalic and atraumatic.  Pulmonary:     Effort: Pulmonary effort is normal.  Musculoskeletal:     Left knee: Crepitus present. Normal range of motion. No tenderness. No LCL laxity, MCL laxity, ACL laxity or PCL laxity.Normal alignment and normal meniscus.     Instability Tests: Anterior drawer test negative. Posterior drawer test negative. Medial McMurray test negative and lateral McMurray test negative.     Left  ankle: Tenderness present over the base of 5th metatarsal. Normal range of motion. Anterior drawer test negative.     Left foot: Tenderness present.     Comments:  ROM findings Thoracic: Lateral flexion and lateral rotation are intact and symmetrical Lumbar: Extension, flexion are intact Hips: Extension, flexion, abduction, adduction are symmetrical and intact Knees: ROM intact with regards to flexion and extension, mild crepitus appreciated. Negative Apley grind on left knee Foot and ankle: Passive ROM is intact with mild pain noted along lateral metatarsals of left foot  Neurological:     General: No focal deficit present.     Mental Status: She is alert and oriented to person, place, and time.     GCS: GCS eye subscore is 4. GCS verbal subscore is 5. GCS motor subscore is 6.     Cranial  Nerves: No cranial nerve deficit, dysarthria or facial asymmetry.     Motor: No weakness, tremor, atrophy or abnormal muscle tone.  Psychiatric:        Mood and Affect: Mood normal.        Behavior: Behavior normal. Behavior is cooperative.        Thought Content: Thought content normal.        Judgment: Judgment normal.       No results found for any visits on 10/02/23.  Assessment & Plan      No follow-ups on file.      Problem List Items Addressed This Visit   None Visit Diagnoses     Acute left-sided low back pain with left-sided sciatica    -  Primary Acute, ongoing Patient reports that she is having left-sided low back pain along with left-sided leg pain that is suspicious for sciatica ROM is overall normal and she denies SI joint tenderness with palpation Reviewed that we will start with conservative management that includes warm compresses, gentle stretches and massage, Tylenol, ibuprofen or Celebrex per her preference If symptoms are persistent or worsening after 4 weeks we will likely need imaging and referral to physical therapy Follow-up as needed for progressing or persistent symptoms   Plantar fasciitis of left foot     Acute, new concern Patient reports tingling along the sole of her left foot that extends from base of her toes to about mid sole. She reports that this feels worse when she takes her shoes off and in the morning when she gets out of bed Symptoms appear consistent with likely plantar fasciitis Recommend massage with a tennis ball or frozen water bottle, wearing shoes with good arch support, using antifatigue mats at work if available If symptoms are not improving with recommended interventions may need to refer to podiatry Follow-up as needed for progressing or persistent symptoms   Acute pain of left knee     Acute on chronic Patient reports that she has had extensive imaging done at [facility] for her knee and hips.  Unable to view this but will  try to request records Unsure if knee pain has caused derangements in hip and gait or if these are unrelated issues. She may be candidate for steroid injection pending review of imaging For now recommend using knee brace, gentle stretches, NSAIDs and ibuprofen as needed, warm compresses to assist with management Follow-up as needed for progressing or persistent symptoms         No follow-ups on file.   I, Clevie Prout E Altamese Deguire, PA-C, have reviewed all documentation for this visit. The documentation on 10/02/23 for the exam,  diagnosis, procedures, and orders are all accurate and complete.   Jacquelin Hawking, MHS, PA-C [facility]

## 2023-12-20 ENCOUNTER — Ambulatory Visit
Admission: EM | Admit: 2023-12-20 | Discharge: 2023-12-20 | Disposition: A | Discharge status: Home / Self Care | Payer: Commercial Managed Care - PPO | Attending: Physician Assistant | Admitting: Physician Assistant

## 2023-12-20 DIAGNOSIS — R509 Fever, unspecified: Secondary | ICD-10-CM | POA: Insufficient documentation

## 2023-12-20 DIAGNOSIS — U071 COVID-19: Secondary | ICD-10-CM | POA: Diagnosis not present

## 2023-12-20 DIAGNOSIS — R051 Acute cough: Secondary | ICD-10-CM | POA: Diagnosis not present

## 2023-12-20 LAB — RESP PANEL BY RT-PCR (RSV, FLU A&B, COVID)  RVPGX2
Influenza A by PCR: NEGATIVE
Influenza B by PCR: NEGATIVE
Resp Syncytial Virus by PCR: NEGATIVE
SARS Coronavirus 2 by RT PCR: POSITIVE — AB

## 2023-12-20 MED ORDER — PROMETHAZINE-DM 6.25-15 MG/5ML PO SYRP: 5.0000 mL | ORAL_SOLUTION | Freq: Four times a day (QID) | ORAL | 0 refills | Status: DC | PRN

## 2023-12-20 NOTE — Discharge Instructions (Addendum)
-  COVID-positive.  Isolate until fever free 24 hours and symptoms are improving. - I sent cough medicine to the pharmacy. - Increase rest and fluids and take Tylenol  or Motrin . - You need to be seen again if you have any uncontrolled fever, weakness or breathing difficulty.

## 2023-12-20 NOTE — ED Provider Notes (Signed)
 [facility]    CSN: [id number] Arrival date & time: 12/20/23  1904      History   Chief Complaint Chief Complaint  Patient presents with   Cough   Generalized Body Aches    HPI Margaret Kramer is a 63 y.o. female presenting for cough, congestion, fatigue, body aches and low-grade fever that began today.  Patient denies sore throat, chest pain, wheezing, shortness of breath, abdominal pain, vomiting or diarrhea.  Reports that a sick family member has been staying with her.  She says she cleaned up his room on Monday night.  She took cough medicine over-the-counter today without much relief.  Has been vaccinated for COVID x 3.  No history of COVID.  No other complaints.  HPI  Past Medical History:  Diagnosis Date   Allergy    Depression 12/29/2016   mild   Diverticulitis    Dyspnea    Heart murmur    History of syncope    Hypertension    Increased pressure in the eye 02/10/2016   Pre-diabetes    Vitamin D  deficiency     Patient Active Problem List   Diagnosis Date Noted   Depression, major, in remission (HCC) 02/07/2022   Chronic left-sided low back pain without sciatica 01/06/2022   Vitiligo 10/15/2020   History of diverticulitis 11/22/2019   Hallux valgus, acquired 03/07/2018   Other insomnia 12/29/2016   Cardiac murmur 02/10/2016   Hypertension, benign 02/10/2016   History of syncope 02/10/2016   Prediabetes 02/10/2016   Increased pressure in the eye 02/10/2016   Vitamin D  deficiency 02/10/2016   Obesity (BMI 30.0-34.9) 02/10/2016    Past Surgical History:  Procedure Laterality Date   CATARACT EXTRACTION W/PHACO Right 08/17/2022   Procedure: CATARACT EXTRACTION PHACO AND INTRAOCULAR LENS PLACEMENT (IOC) RIGHT DIABETIC 3.79 00:30.9;  Surgeon: Mittie Gaskin, MD;  Location: [facility];  Service: Ophthalmology;  Laterality: Right;   COLONOSCOPY     COLONOSCOPY WITH PROPOFOL  N/A 07/04/2018   Procedure: COLONOSCOPY WITH PROPOFOL ;   Surgeon: Toledo, Ladell POUR, MD;  Location: [facility];  Service: Gastroenterology;  Laterality: N/A;   ganglion cyst removal Right     OB History   No obstetric history on file.      Home Medications    Prior to Admission medications   Medication Sig Start Date End Date Taking? Authorizing Provider  celecoxib  (CELEBREX ) 200 MG capsule Take 1 capsule (200 mg total) by mouth daily. 08/10/22  Yes Sowles, Krichna, MD  Cholecalciferol (VITAMIN D3 PO) Take by mouth daily.   Yes [provider]  cyclopentolate (CYCLODRYL,CYCLOGYL) 1 % ophthalmic solution 1 drop 3 (three) times daily. 12/30/22  Yes [provider]  FIBER PO Take by mouth daily.   Yes [provider]  fluticasone  (FLONASE ) 50 MCG/ACT nasal spray Place 2 sprays into both nostrils daily. 07/22/22  Yes Bernardino Ditch, NP  Multiple Vitamin (MULTIVITAMIN) tablet Take 1 tablet by mouth daily.   Yes [provider]  promethazine -dextromethorphan (PROMETHAZINE -DM) 6.25-15 MG/5ML syrup Take 5 mLs by mouth 4 (four) times daily as needed. 12/20/23  Yes Arvis Jolan NOVAK, PA-C    Family History Family History  Problem Relation Age of Onset   Diabetes Mother    Hypothyroidism Mother    Alcoholism Father    Dementia Father    Cancer Brother 51       lung cancer   Breast cancer Maternal Aunt 71   Colon cancer Paternal Uncle  Pancreatic cancer Paternal Uncle    Breast cancer Cousin 40       mat cousin    Social History Social History   Tobacco Use   Smoking status: Never   Smokeless tobacco: Never  Vaping Use   Vaping status: Never Used  Substance Use Topics   Alcohol use: Yes    Alcohol/week: 0.0 standard drinks of alcohol    Comment: occasionally   Drug use: No     Allergies   Shellfish allergy   Review of Systems Review of Systems  Constitutional:  Positive for fatigue and fever. Negative for chills and diaphoresis.  HENT:  Positive for congestion and rhinorrhea. Negative for  ear pain, sinus pressure, sinus pain and sore throat.   Respiratory:  Positive for cough. Negative for shortness of breath.   Cardiovascular:  Negative for chest pain.  Gastrointestinal:  Negative for abdominal pain, nausea and vomiting.  Musculoskeletal:  Positive for back pain and myalgias.  Skin:  Negative for rash.  Neurological:  Negative for weakness and headaches.  Hematological:  Negative for adenopathy.     Physical Exam Triage Vital Signs ED Triage Vitals  Encounter Vitals Group     BP 12/20/23 1908 (!) 137/97     Systolic BP Percentile --      Diastolic BP Percentile --      Pulse Rate 12/20/23 1908 86     Resp 12/20/23 1908 16     Temp 12/20/23 1908 (!) 100.7 F (38.2 C)     Temp Source 12/20/23 1908 Oral     SpO2 12/20/23 1908 100 %     Weight 12/20/23 1908 172 lb (78 kg)     Height 12/20/23 1908 5' 3 (1.6 m)     Head Circumference --      Peak Flow --      Pain Score 12/20/23 1911 5     Pain Loc --      Pain Education --      Exclude from Growth Chart --    No data found.  Updated Vital Signs BP (!) 137/97 (BP Location: Left Arm)   Pulse 86   Temp (!) 100.7 F (38.2 C) (Oral)   Resp 16   Ht 5' 3 (1.6 m)   Wt 172 lb (78 kg)   SpO2 100%   BMI 30.47 kg/m    Physical Exam Vitals and nursing note reviewed.  Constitutional:      General: She is not in acute distress.    Appearance: Normal appearance. She is not ill-appearing or toxic-appearing.  HENT:     Head: Normocephalic and atraumatic.     Nose: Congestion present.     Mouth/Throat:     Mouth: Mucous membranes are moist.     Pharynx: Oropharynx is clear.  Eyes:     General: No scleral icterus.       Right eye: No discharge.        Left eye: No discharge.     Conjunctiva/sclera: Conjunctivae normal.  Cardiovascular:     Rate and Rhythm: Normal rate and regular rhythm.     Heart sounds: Normal heart sounds.  Pulmonary:     Effort: Pulmonary effort is normal. No respiratory distress.      Breath sounds: Normal breath sounds.  Musculoskeletal:     Cervical back: Neck supple.  Skin:    General: Skin is dry.  Neurological:     General: No focal deficit present.     Mental  Status: She is alert. Mental status is at baseline.     Motor: No weakness.     Gait: Gait normal.  Psychiatric:        Mood and Affect: Mood normal.        Behavior: Behavior normal.      UC Treatments / Results  Labs (all labs ordered are listed, but only abnormal results are displayed) Labs Reviewed  RESP PANEL BY RT-PCR (RSV, FLU A&B, COVID)  [id number]    EKG   Radiology No results found.  Procedures Procedures (including critical care time)  Medications Ordered in UC Medications - No data to display  Initial Impression / Assessment and Plan / UC Course  I have reviewed the triage vital signs and the nursing notes.  Pertinent labs & imaging results that were available during my care of the patient were reviewed by me and considered in my medical decision making (see chart for details).   63 year old female presents for cough, congestion, fatigue, runny nose and bodyaches that began today.  Current temp 100.7 degrees.  Patient has overall well-appearing.  No acute distress.  Has nasal congestion.  Throat clear.  Chest clear to auscultation.  Heart regular rate and rhythm.  Respiratory panel obtained.  Positive COVID-19.  Reviewed current CDC guidelines, isolation protocol and ED precautions.  Discussed use of antiviral medication including Paxlovid and molnupiravir.  Patient is relatively low risk.  Decides to hold off on antiviral medication at this time.  Sent Promethazine  DM to pharmacy.  Work note provided.   Final Clinical Impressions(s) / UC Diagnoses   Final diagnoses:  COVID-19  Fever, unspecified  Acute cough     Discharge Instructions      -COVID-positive.  Isolate until fever free 24 hours and symptoms are improving. - I sent cough medicine to the pharmacy. -  Increase rest and fluids and take Tylenol  or Motrin . - You need to be seen again if you have any uncontrolled fever, weakness or breathing difficulty.     ED Prescriptions     Medication Sig Dispense Auth. Provider   promethazine -dextromethorphan (PROMETHAZINE -DM) 6.25-15 MG/5ML syrup Take 5 mLs by mouth 4 (four) times daily as needed. 118 mL Arvis Jolan NOVAK, PA-C      PDMP not reviewed this encounter.   Arvis Jolan NOVAK, PA-C 12/21/23 [phone]

## 2023-12-20 NOTE — ED Triage Notes (Signed)
Pt c/o cough & bodyaches since this AM. Has tried tussin & effervescent w/minor relief.

## 2024-02-28 ENCOUNTER — Encounter: Payer: Commercial Managed Care - PPO | Admitting: Family Medicine

## 2024-03-11 ENCOUNTER — Encounter: Payer: Self-pay | Admitting: Family Medicine

## 2024-03-11 ENCOUNTER — Ambulatory Visit (INDEPENDENT_AMBULATORY_CARE_PROVIDER_SITE_OTHER): Payer: Self-pay | Admitting: Family Medicine

## 2024-03-11 VITALS — BP 138/84 | HR 73 | Resp 16 | Ht 63.0 in | Wt 179.4 lb

## 2024-03-11 DIAGNOSIS — J302 Other seasonal allergic rhinitis: Secondary | ICD-10-CM | POA: Insufficient documentation

## 2024-03-11 DIAGNOSIS — E785 Hyperlipidemia, unspecified: Secondary | ICD-10-CM | POA: Insufficient documentation

## 2024-03-11 DIAGNOSIS — Z0001 Encounter for general adult medical examination with abnormal findings: Secondary | ICD-10-CM | POA: Diagnosis not present

## 2024-03-11 DIAGNOSIS — Z0000 Encounter for general adult medical examination without abnormal findings: Secondary | ICD-10-CM | POA: Diagnosis not present

## 2024-03-11 DIAGNOSIS — Z8679 Personal history of other diseases of the circulatory system: Secondary | ICD-10-CM

## 2024-03-11 DIAGNOSIS — N904 Leukoplakia of vulva: Secondary | ICD-10-CM | POA: Insufficient documentation

## 2024-03-11 DIAGNOSIS — M170 Bilateral primary osteoarthritis of knee: Secondary | ICD-10-CM | POA: Insufficient documentation

## 2024-03-11 DIAGNOSIS — E559 Vitamin D deficiency, unspecified: Secondary | ICD-10-CM | POA: Diagnosis not present

## 2024-03-11 DIAGNOSIS — Z13820 Encounter for screening for osteoporosis: Secondary | ICD-10-CM

## 2024-03-11 DIAGNOSIS — R7303 Prediabetes: Secondary | ICD-10-CM

## 2024-03-11 DIAGNOSIS — Z1231 Encounter for screening mammogram for malignant neoplasm of breast: Secondary | ICD-10-CM | POA: Diagnosis not present

## 2024-03-11 MED ORDER — CELECOXIB 200 MG PO CAPS: 200.0000 mg | ORAL_CAPSULE | Freq: Every day | ORAL | 1 refills | Status: AC

## 2024-03-11 MED ORDER — CLOBETASOL PROPIONATE 0.05 % EX CREA: TOPICAL_CREAM | Freq: Two times a day (BID) | CUTANEOUS | 0 refills | Status: AC

## 2024-03-11 NOTE — Progress Notes (Signed)
 Name: Margaret Kramer   MRN: [id number]    DOB: Jul 21, 1961   Date:03/11/2024       Progress Note  Subjective  Chief Complaint  Chief Complaint  Patient presents with   Annual Exam    HPI  Patient presents for annual CPE.  Discussed the use of AI scribe software for clinical note transcription with the patient, who gave verbal consent to proceed.  History of Present Illness Margaret Kramer "Margaret Kramer" is a 63 year old female who presents for an annual physical exam and follow-up on her chronic conditions.  She has hypertension with home blood pressure readings around 133/87 mmHg and is not on antihypertensive medication. Her blood pressure today is 138/84 mmHg. No symptoms of excessive hunger, thirst, or frequent urination are present.  She experiences chronic low back pain and osteoarthritis, primarily affecting her knees and fingers. Stiffness and pain are noted in her fingers, particularly in the distal joints, without swelling. Her knees occasionally swell without redness or warmth. She takes Celebrex  as needed for arthritis pain but does not use it daily.  She has a history of diverticulitis and reports occasional bloating and discomfort, managed with dietary adjustments, including increased fiber intake. She has regular bowel movements twice a day and denies constipation or blood in her stools.  She has a history of lichen sclerosus for which she previously used clobetasol cream. She used it as directed for two weeks and then tapered off. She has run out of the medication and has not used it recently.  She reports seasonal allergies, managed with over-the-counter loratadine. She no longer experiences significant nasal congestion since removing carpets and losing her dogs, which she was allergic to.  Her vitamin D  levels were previously low, and she is currently taking a vitamin D  supplement daily. She also takes magnesium supplements.  She follows a diet low in refined sugars  and meats, which she adopted during a spiritual fast with her church. She lost about 12-15 pounds during this period and continues to avoid refined sugars, which she finds beneficial for her health.      Diet: she fasted earlier this year, spiritual fast - avoiding refined sugar and meat  but not following a diet now  Exercise: discussed regular physical activity   Last Eye Exam: completed Last Dental Exam: completed  Flowsheet Row Office Visit from 03/11/2024 in [facility]  AUDIT-C Score 1      Depression: Phq 9 is  negative    03/11/2024    8:52 AM 10/02/2023    2:55 PM 02/08/2023    8:35 AM 08/10/2022    8:14 AM 02/07/2022    8:26 AM  Depression screen PHQ 2/9  Decreased Interest 0 0 0 1 0  Down, Depressed, Hopeless 0 0 0 0 0  PHQ - 2 Score 0 0 0 1 0  Altered sleeping 0 0 1 3 3   Tired, decreased energy 0 0 0 0 0  Change in appetite 0 0 1 0 0  Feeling bad or failure about yourself  0 0 0 0 0  Trouble concentrating 0 0 0 1 0  Moving slowly or fidgety/restless 0 0 0 0 0  Suicidal thoughts 0 0 0 0 0  PHQ-9 Score 0 0 2 5 3   Difficult doing work/chores Not difficult at all Not difficult at all Not difficult at all     Hypertension: BP Readings from Last 3 Encounters:  03/11/24 138/84  12/20/23 (!) 137/97  10/02/23 136/84   Obesity: Wt Readings from Last 3 Encounters:  03/11/24 179 lb 6.4 oz (81.4 kg)  12/20/23 172 lb (78 kg)  10/02/23 176 lb 8 oz (80.1 kg)   BMI Readings from Last 3 Encounters:  03/11/24 31.78 kg/m  12/20/23 30.47 kg/m  10/02/23 31.27 kg/m     Vaccines: reviewed with the patient.   Hep C Screening: completed STD testing and prevention (HIV/chl/gon/syphilis): N/A Intimate partner violence: negative screen  Sexual History :not sexually active Menstrual History/LMP/Abnormal Bleeding: post menopausal  Discussed importance of follow up if any post-menopausal bleeding: yes  Incontinence Symptoms: negative for symptoms    Breast cancer:  - Last Mammogram: up to date  - BRCA gene screening: N/A  Osteoporosis Prevention : Discussed high calcium and vitamin D  supplementation, weight bearing exercises Bone density :she will check coverage with insurance  Cervical cancer screening: up-to-date recheck next year   Skin cancer: Discussed monitoring for atypical lesions  Colorectal cancer: up to date    Lung cancer:  Low Dose CT Chest recommended if Age 6-80 years, 20 pack-year currently smoking OR have quit w/in 15years. Patient does not qualify for screen   ECG: 2021  Advanced Care Planning: A voluntary discussion about advance care planning including the explanation and discussion of advance directives.  Discussed health care proxy and Living will, and the patient was able to identify a health care proxy as Margaret Kramer.  Patient does not have a living will and power of attorney of health care   Patient Active Problem List   Diagnosis Date Noted   Seasonal allergies 03/11/2024   Primary osteoarthritis of both knees 03/11/2024   Dyslipidemia 03/11/2024   Lichen sclerosus of vulva 03/11/2024   Depression, major, in remission (HCC) 02/07/2022   Chronic left-sided low back pain without sciatica 01/06/2022   Vitiligo 10/15/2020   History of diverticulitis 11/22/2019   Hallux valgus, acquired 03/07/2018   Other insomnia 12/29/2016   Cardiac murmur 02/10/2016   Hypertension, benign 02/10/2016   History of syncope 02/10/2016   Prediabetes 02/10/2016   Increased pressure in the eye 02/10/2016   Vitamin D  deficiency 02/10/2016   Obesity (BMI 30.0-34.9) 02/10/2016    Past Surgical History:  Procedure Laterality Date   CATARACT EXTRACTION W/PHACO Right 08/17/2022   Procedure: CATARACT EXTRACTION PHACO AND INTRAOCULAR LENS PLACEMENT (IOC) RIGHT DIABETIC 3.79 00:30.9;  Surgeon: Annell Kidney, MD;  Location: [facility];  Service: Ophthalmology;  Laterality: Right;   COLONOSCOPY     COLONOSCOPY  WITH PROPOFOL  N/A 07/04/2018   Procedure: COLONOSCOPY WITH PROPOFOL ;  Surgeon: Toledo, Alphonsus Jeans, MD;  Location: [facility];  Service: Gastroenterology;  Laterality: N/A;   ganglion cyst removal Right     Family History  Problem Relation Age of Onset   Diabetes Mother    Hypothyroidism Mother    Alcoholism Father    Dementia Father    Cancer Brother 13       lung cancer   Breast cancer Maternal Aunt 71   Colon cancer Paternal Uncle    Pancreatic cancer Paternal Uncle    Breast cancer Cousin 40       mat cousin    Social History   Socioeconomic History   Marital status: Single    Spouse name: Not on file   Number of children: 0   Years of education: Not on file   Highest education level: Not on file  Occupational History   Not on file  Tobacco Use  Smoking status: Never   Smokeless tobacco: Never  Vaping Use   Vaping status: Never Used  Substance and Sexual Activity   Alcohol use: Yes    Alcohol/week: 0.0 standard drinks of alcohol    Comment: occasionally   Drug use: No   Sexual activity: Never  Other Topics Concern   Not on file  Social History Narrative   Not on file   Social Drivers of Health   Financial Resource Strain: Low Risk  (03/11/2024)   Overall Financial Resource Strain (CARDIA)    Difficulty of Paying Living Expenses: Not hard at all  Food Insecurity: No Food Insecurity (03/11/2024)   Hunger Vital Sign    Worried About Running Out of Food in the Last Year: Never true    Ran Out of Food in the Last Year: Never true  Transportation Needs: No Transportation Needs (03/11/2024)   PRAPARE - Administrator, Civil Service (Medical): No    Lack of Transportation (Non-Medical): No  Physical Activity: Inactive (03/11/2024)   Exercise Vital Sign    Days of Exercise per Week: 0 days    Minutes of Exercise per Session: 0 min  Stress: Stress Concern Present (03/11/2024)   [employer] of Occupational Health - Occupational Stress  Questionnaire    Feeling of Stress : To some extent  Social Connections: Moderately Isolated (03/11/2024)   Social Connection and Isolation Panel [NHANES]    Frequency of Communication with Friends and Family: Three times a week    Frequency of Social Gatherings with Friends and Family: Three times a week    Attends Religious Services: More than 4 times per year    Active Member of Clubs or Organizations: No    Attends Banker Meetings: Never    Marital Status: Never married  Intimate Partner Violence: Not At Risk (03/11/2024)   Humiliation, Afraid, Rape, and Kick questionnaire    Fear of Current or Ex-Partner: No    Emotionally Abused: No    Physically Abused: No    Sexually Abused: No     Current Outpatient Medications:    acetaminophen  (TYLENOL ) 325 MG tablet, Take 325 mg by mouth every 6 (six) hours as needed for mild pain (pain score 1-3)., Disp: , Rfl:    Cholecalciferol (VITAMIN D3 PO), Take by mouth daily., Disp: , Rfl:    FIBER PO, Take by mouth daily., Disp: , Rfl:    Multiple Vitamin (MULTIVITAMIN) tablet, Take 1 tablet by mouth daily., Disp: , Rfl:    celecoxib  (CELEBREX ) 200 MG capsule, Take 1 capsule (200 mg total) by mouth daily., Disp: 90 capsule, Rfl: 1   clobetasol cream (TEMOVATE) 0.05 %, Apply topically 2 (two) times daily., Disp: 60 g, Rfl: 0  Allergies  Allergen Reactions   Shellfish Allergy Itching    Mouth and tongue     ROS  Ten systems reviewed and is negative except as mentioned in HPI    Objective  Vitals:   03/11/24 0858  BP: 138/84  Pulse: 73  Resp: 16  SpO2: 99%  Weight: 179 lb 6.4 oz (81.4 kg)  Height: 5\' 3"  (1.6 m)    Body mass index is 31.78 kg/m.  Physical Exam  Constitutional: Patient appears well-developed and well-nourished. No distress.  HENT: Head: Normocephalic and atraumatic. Ears: B TMs ok, no erythema or effusion; Nose: Nose normal. Mouth/Throat: Oropharynx is clear and moist. No oropharyngeal exudate.   Eyes: Conjunctivae and EOM are normal. Pupils are equal, round, and reactive  to light. No scleral icterus.  Neck: Normal range of motion. Neck supple. No JVD present. No thyromegaly present.  Cardiovascular: Normal rate, regular rhythm and normal heart sounds.  No murmur heard. No BLE edema. Pulmonary/Chest: Effort normal and breath sounds normal. No respiratory distress. Abdominal: Soft. Bowel sounds are normal, no distension. There is no tenderness. no masses Breast: no lumps or masses, no nipple discharge or rashes FEMALE GENITALIA:  Not done  RECTAL: not done  Musculoskeletal: Normal range of motion, no joint effusions. No gross deformities Neurological: he is alert and oriented to person, place, and time. No cranial nerve deficit. Coordination, balance, strength, speech and gait are normal.  Skin: Skin is warm and dry. No rash noted. No erythema.  Psychiatric: Patient has a normal mood and affect. behavior is normal. Judgment and thought content normal.     Assessment & Plan  1. Well adult exam (Primary)  - CBC with Differential/Platelet - Comprehensive metabolic panel with GFR  2. Encounter for screening mammogram for malignant neoplasm of breast  - MM 3D SCREENING MAMMOGRAM BILATERAL BREAST; Future  3. Prediabetes  - Hemoglobin A1c  4. History of hypertension  Continue monitor bp at home   5. Vitamin D  deficiency  - VITAMIN D  25 Hydroxy (Vit-D Deficiency, Fractures)  6. Dyslipidemia  - Lipid panel  7. Primary osteoarthritis of both knees  - celecoxib  (CELEBREX ) 200 MG capsule; Take 1 capsule (200 mg total) by mouth daily.  Dispense: 90 capsule; Refill: 1  8. Seasonal allergies  Taking otc medication   9. Lichen sclerosus of vulva  - clobetasol cream (TEMOVATE) 0.05 %; Apply topically 2 (two) times daily.  Dispense: 60 g; Refill: 0  10. Osteoporosis screening  - DG Bone Density; Future     -USPSTF grade A and B recommendations reviewed with  patient; age-appropriate recommendations, preventive care, screening tests, etc discussed and encouraged; healthy living encouraged; see AVS for patient education given to patient -Discussed importance of 150 minutes of physical activity weekly, eat two servings of fish weekly, eat one serving of tree nuts ( cashews, pistachios, pecans, almonds.Aaron Aas) every other day, eat 6 servings of fruit/vegetables daily and drink plenty of water and avoid sweet beverages.   -Reviewed Health Maintenance: Yes.

## 2024-03-12 ENCOUNTER — Encounter: Payer: Self-pay | Admitting: Family Medicine

## 2024-03-12 LAB — LIPID PANEL
Cholesterol: 224 mg/dL — ABNORMAL HIGH (ref <200)
HDL: 87 mg/dL (ref >=50)
LDL Cholesterol (Calc): 120 mg/dL — ABNORMAL HIGH
Non-HDL Cholesterol (Calc): 137 mg/dL — ABNORMAL HIGH (ref <130)
Total CHOL/HDL Ratio: 2.6 (calc) (ref <5.0)
Triglycerides: 76 mg/dL (ref <150)

## 2024-03-12 LAB — CBC WITH DIFFERENTIAL/PLATELET
Absolute Lymphocytes: 2595 {cells}/uL (ref 850–3900)
Absolute Monocytes: 322 {cells}/uL (ref 200–950)
Basophils Absolute: 31 {cells}/uL (ref 0–200)
Basophils Relative: 0.6 %
Eosinophils Absolute: 151 {cells}/uL (ref 15–500)
Eosinophils Relative: 2.9 %
HCT: 38.2 % (ref 35.0–45.0)
Hemoglobin: 12.4 g/dL (ref 11.7–15.5)
MCH: 29.2 pg (ref 27.0–33.0)
MCHC: 32.5 g/dL (ref 32.0–36.0)
MCV: 89.9 fL (ref 80.0–100.0)
MPV: 10.4 fL (ref 7.5–12.5)
Monocytes Relative: 6.2 %
Neutro Abs: 2101 {cells}/uL (ref 1500–7800)
Neutrophils Relative %: 40.4 %
Platelets: 333 10*3/uL (ref 140–400)
RBC: 4.25 10*6/uL (ref 3.80–5.10)
RDW: 12.4 % (ref 11.0–15.0)
Total Lymphocyte: 49.9 %
WBC: 5.2 10*3/uL (ref 3.8–10.8)

## 2024-03-12 LAB — HEMOGLOBIN A1C
Hgb A1c MFr Bld: 5.6 % (ref <5.7)
Mean Plasma Glucose: 114 mg/dL
eAG (mmol/L): 6.3 mmol/L

## 2024-03-12 LAB — VITAMIN D 25 HYDROXY (VIT D DEFICIENCY, FRACTURES): Vit D, 25-Hydroxy: 40 ng/mL (ref 30–100)

## 2024-03-12 LAB — COMPREHENSIVE METABOLIC PANEL WITH GFR
AG Ratio: 1.5 (calc) (ref 1.0–2.5)
ALT: 21 U/L (ref 6–29)
AST: 16 U/L (ref 10–35)
Albumin: 4.5 g/dL (ref 3.6–5.1)
Alkaline phosphatase (APISO): 113 U/L (ref 37–153)
BUN: 16 mg/dL (ref 7–25)
CO2: 30 mmol/L (ref 20–32)
Calcium: 9.6 mg/dL (ref 8.6–10.4)
Chloride: 100 mmol/L (ref 98–110)
Creat: 0.65 mg/dL (ref 0.50–1.05)
Globulin: 3.1 g/dL (ref 1.9–3.7)
Glucose, Bld: 86 mg/dL (ref 65–139)
Potassium: 4.2 mmol/L (ref 3.5–5.3)
Sodium: 138 mmol/L (ref 135–146)
Total Bilirubin: 0.6 mg/dL (ref 0.2–1.2)
Total Protein: 7.6 g/dL (ref 6.1–8.1)
eGFR: 99 mL/min/{1.73_m2} (ref >=60)

## 2024-03-19 DIAGNOSIS — H40003 Preglaucoma, unspecified, bilateral: Secondary | ICD-10-CM | POA: Diagnosis not present

## 2024-03-19 DIAGNOSIS — Z961 Presence of intraocular lens: Secondary | ICD-10-CM | POA: Diagnosis not present

## 2024-03-19 DIAGNOSIS — H209 Unspecified iridocyclitis: Secondary | ICD-10-CM | POA: Diagnosis not present

## 2024-03-19 DIAGNOSIS — H2512 Age-related nuclear cataract, left eye: Secondary | ICD-10-CM | POA: Diagnosis not present

## 2024-03-19 LAB — HM DIABETES EYE EXAM

## 2024-07-29 ENCOUNTER — Ambulatory Visit
Admission: RE | Admit: 2024-07-29 | Discharge: 2024-07-29 | Disposition: A | Discharge status: Home / Self Care | Source: Ambulatory Visit | Attending: Family Medicine | Admitting: Family Medicine

## 2024-07-29 ENCOUNTER — Ambulatory Visit: Payer: Self-pay | Admitting: Family Medicine

## 2024-07-29 DIAGNOSIS — Z1231 Encounter for screening mammogram for malignant neoplasm of breast: Secondary | ICD-10-CM | POA: Diagnosis not present

## 2024-07-29 DIAGNOSIS — M8588 Other specified disorders of bone density and structure, other site: Secondary | ICD-10-CM | POA: Diagnosis not present

## 2024-07-29 DIAGNOSIS — Z13820 Encounter for screening for osteoporosis: Secondary | ICD-10-CM | POA: Diagnosis not present

## 2024-07-29 DIAGNOSIS — Z780 Asymptomatic menopausal state: Secondary | ICD-10-CM | POA: Diagnosis not present

## 2024-09-19 DIAGNOSIS — H40003 Preglaucoma, unspecified, bilateral: Secondary | ICD-10-CM | POA: Diagnosis not present

## 2024-09-19 DIAGNOSIS — E119 Type 2 diabetes mellitus without complications: Secondary | ICD-10-CM | POA: Diagnosis not present

## 2024-09-19 DIAGNOSIS — H2512 Age-related nuclear cataract, left eye: Secondary | ICD-10-CM | POA: Diagnosis not present

## 2024-09-19 DIAGNOSIS — Z961 Presence of intraocular lens: Secondary | ICD-10-CM | POA: Diagnosis not present

## 2024-09-29 DIAGNOSIS — M1712 Unilateral primary osteoarthritis, left knee: Secondary | ICD-10-CM | POA: Diagnosis not present

## 2025-03-13 ENCOUNTER — Encounter: Admitting: Family Medicine
